# Patient Record
Sex: Female | Born: 1990 | Hispanic: No | Marital: Married | Smoking: Never smoker
Health system: Southern US, Community
[De-identification: ages and names within clinical notes are randomized; demographics above are authoritative.]

## PROBLEM LIST (undated history)

## (undated) DIAGNOSIS — F32A Depression, unspecified: Secondary | ICD-10-CM

## (undated) DIAGNOSIS — G43909 Migraine, unspecified, not intractable, without status migrainosus: Secondary | ICD-10-CM

## (undated) DIAGNOSIS — T7840XA Allergy, unspecified, initial encounter: Secondary | ICD-10-CM

## (undated) DIAGNOSIS — S62109A Fracture of unspecified carpal bone, unspecified wrist, initial encounter for closed fracture: Secondary | ICD-10-CM

## (undated) DIAGNOSIS — E282 Polycystic ovarian syndrome: Secondary | ICD-10-CM

## (undated) DIAGNOSIS — F419 Anxiety disorder, unspecified: Secondary | ICD-10-CM

## (undated) DIAGNOSIS — K219 Gastro-esophageal reflux disease without esophagitis: Secondary | ICD-10-CM

## (undated) HISTORY — DX: Migraine, unspecified, not intractable, without status migrainosus: G43.909

## (undated) HISTORY — DX: Depression, unspecified: F32.A

## (undated) HISTORY — DX: Fracture of unspecified carpal bone, unspecified wrist, initial encounter for closed fracture: S62.109A

## (undated) HISTORY — DX: Anxiety disorder, unspecified: F41.9

## (undated) HISTORY — DX: Allergy, unspecified, initial encounter: T78.40XA

## (undated) HISTORY — DX: Polycystic ovarian syndrome: E28.2

## (undated) HISTORY — DX: Gastro-esophageal reflux disease without esophagitis: K21.9

---

## 2019-12-02 HISTORY — PX: CHOLECYSTECTOMY: SHX55

## 2021-05-08 ENCOUNTER — Ambulatory Visit: Payer: Self-pay | Admitting: Nurse Practitioner

## 2021-05-21 ENCOUNTER — Encounter: Payer: Self-pay | Admitting: Family Medicine

## 2021-05-21 ENCOUNTER — Other Ambulatory Visit: Payer: Self-pay

## 2021-05-21 ENCOUNTER — Ambulatory Visit: Payer: BC Managed Care – PPO | Admitting: Family Medicine

## 2021-05-21 ENCOUNTER — Telehealth: Payer: Self-pay

## 2021-05-21 VITALS — BP 124/76 | HR 90 | Temp 98.9°F | Ht 64.0 in | Wt 256.0 lb

## 2021-05-21 DIAGNOSIS — K2100 Gastro-esophageal reflux disease with esophagitis, without bleeding: Secondary | ICD-10-CM | POA: Diagnosis not present

## 2021-05-21 DIAGNOSIS — E282 Polycystic ovarian syndrome: Secondary | ICD-10-CM | POA: Insufficient documentation

## 2021-05-21 DIAGNOSIS — E66813 Obesity, class 3: Secondary | ICD-10-CM | POA: Insufficient documentation

## 2021-05-21 DIAGNOSIS — Z3041 Encounter for surveillance of contraceptive pills: Secondary | ICD-10-CM

## 2021-05-21 DIAGNOSIS — G43709 Chronic migraine without aura, not intractable, without status migrainosus: Secondary | ICD-10-CM

## 2021-05-21 DIAGNOSIS — R1011 Right upper quadrant pain: Secondary | ICD-10-CM | POA: Diagnosis not present

## 2021-05-21 DIAGNOSIS — F419 Anxiety disorder, unspecified: Secondary | ICD-10-CM

## 2021-05-21 DIAGNOSIS — G43901 Migraine, unspecified, not intractable, with status migrainosus: Secondary | ICD-10-CM

## 2021-05-21 DIAGNOSIS — F32A Depression, unspecified: Secondary | ICD-10-CM

## 2021-05-21 MED ORDER — BUPROPION HCL ER (SR) 100 MG PO TB12: 200.0000 mg | ORAL_TABLET | Freq: Every day | ORAL | 2 refills | Status: DC

## 2021-05-21 MED ORDER — LO LOESTRIN FE 1 MG-10 MCG / 10 MCG PO TABS: 1.0000 | ORAL_TABLET | Freq: Every day | ORAL | 2 refills | Status: DC

## 2021-05-21 MED ORDER — PROPRANOLOL HCL 20 MG PO TABS: 20.0000 mg | ORAL_TABLET | Freq: Two times a day (BID) | ORAL | 2 refills | Status: DC

## 2021-05-21 MED ORDER — METFORMIN HCL ER (MOD) 1000 MG PO TB24: 1000.0000 mg | ORAL_TABLET | Freq: Every day | ORAL | 2 refills | Status: DC

## 2021-05-21 MED ORDER — NURTEC 75 MG PO TBDP: 75.0000 mg | ORAL_TABLET | ORAL | 2 refills | Status: DC

## 2021-05-21 MED ORDER — FAMOTIDINE 20 MG PO TABS: 20.0000 mg | ORAL_TABLET | Freq: Two times a day (BID) | ORAL | 2 refills | Status: DC

## 2021-05-21 NOTE — Patient Instructions (Signed)
-   Dose medications as prescribed - Review information provided - Obtain x-ray - Return in 2-4 weeks for annual physical - Contact for any questions

## 2021-05-21 NOTE — Telephone Encounter (Signed)
[facility] referring for (polycystic ovarian syndrome), Encounter for surveillance of contraceptive pills. Called and left voicemail for patient to call back to be scheduled.

## 2021-05-22 DIAGNOSIS — G43909 Migraine, unspecified, not intractable, without status migrainosus: Secondary | ICD-10-CM | POA: Insufficient documentation

## 2021-05-22 NOTE — Assessment & Plan Note (Signed)
Noted in the setting of comorbid PCOS, risk stratification labs to be ordered at follow-up for annual physical. We have placed referral to GYN for further evaluation and management of PCOS as well.

## 2021-05-22 NOTE — Progress Notes (Signed)
Primary Care / Sports Medicine Office Visit  Patient Information:  Patient ID: Vicki Yang, female DOB: [birth year] Age: 30 y.o. MRN: [id number]   Vicki Yang is a pleasant 30 y.o. female presenting with the following:  Chief Complaint  Patient presents with   New Patient (Initial Visit)   Establish Care    Moved from [place] 01/2021 and recently got insurance    Review of Systems pertinent details above   Patient Active Problem List   Diagnosis Date Noted   Migraine    PCOS (polycystic ovarian syndrome) 05/21/2021   Right upper quadrant abdominal pain 05/21/2021   Gastroesophageal reflux disease with esophagitis 05/21/2021   Obesity, Class III, BMI 40-49.9 (morbid obesity) (HCC) 05/21/2021   Anxiety and depression 05/21/2021   Past Medical History:  Diagnosis Date   Anxiety    Broken wrist    bilateral   Depression    GERD (gastroesophageal reflux disease)    Migraine    PCOS (polycystic ovarian syndrome)    Outpatient Encounter Medications as of 05/21/2021  Medication Sig   metFORMIN (GLUMETZA) 1000 MG (MOD) 24 hr tablet Take 1 tablet (1,000 mg total) by mouth daily with breakfast.   [DISCONTINUED] buPROPion ER (WELLBUTRIN SR) 100 MG 12 hr tablet Take 200 mg by mouth daily.   [DISCONTINUED] famotidine (PEPCID) 20 MG tablet Take 20 mg by mouth 2 (two) times daily.   [DISCONTINUED] metFORMIN (GLUCOPHAGE) 500 MG tablet Take 500 mg by mouth daily with breakfast.   [DISCONTINUED] Norethindrone-Ethinyl Estradiol-Fe Biphas (LO LOESTRIN FE) 1 MG-10 MCG / 10 MCG tablet Take 1 tablet by mouth daily.   [DISCONTINUED] propranolol (INDERAL) 20 MG tablet Take 20 mg by mouth 2 (two) times daily.   [DISCONTINUED] Rimegepant Sulfate (NURTEC) 75 MG TBDP Take 75 mg by mouth once as needed.   buPROPion ER (WELLBUTRIN SR) 100 MG 12 hr tablet Take 2 tablets (200 mg total) by mouth daily.   famotidine (PEPCID) 20 MG tablet Take 1 tablet (20 mg total) by mouth 2 (two) times daily.    Norethindrone-Ethinyl Estradiol-Fe Biphas (LO LOESTRIN FE) 1 MG-10 MCG / 10 MCG tablet Take 1 tablet by mouth daily.   propranolol (INDERAL) 20 MG tablet Take 1 tablet (20 mg total) by mouth 2 (two) times daily.   Rimegepant Sulfate (NURTEC) 75 MG TBDP Take 75 mg by mouth every other day.   No facility-administered encounter medications on file as of 05/21/2021.   Past Surgical History:  Procedure Laterality Date   CHOLECYSTECTOMY  12/2019    Vitals:   05/21/21 0905  BP: 124/76  Pulse: 90  Temp: 98.9 F (37.2 C)  SpO2: 97%   Vitals:   05/21/21 0905  Weight: 256 lb (116.1 kg)  Height: 5\' 4"  (1.626 m)   Body mass index is 43.94 kg/m.  No results found.   Independent interpretation of notes and tests performed by another provider:   None  Procedures performed:   None  Pertinent History, Exam, Impression, and Recommendations:   Gastroesophageal reflux disease with esophagitis Chronic issue that is controlled with twice daily dosing of famotidine 20 mg.  Examination today reveals benign abdominal findings save for hypoactive bowel sounds.  KUB ordered today, medication management performed.  She will return for annual physical where risk stratification labs to be ordered, H. pylori testing to be considered as well.  Anxiety and depression Depression screen [facility] 2/9 05/21/2021  Decreased Interest 0  Down, Depressed, Hopeless 0  PHQ - 2 Score  0  Altered sleeping 0  Tired, decreased energy 3  Change in appetite 1  Feeling bad or failure about yourself  1  Trouble concentrating 1  Moving slowly or fidgety/restless 0  Suicidal thoughts 0  PHQ-9 Score 6  Difficult doing work/chores Somewhat difficult   GAD 7 : Generalized Anxiety Score 05/21/2021  Nervous, Anxious, on Edge 3  Control/stop worrying 2  Worry too much - different things 2  Trouble relaxing 1  Restless 0  Easily annoyed or irritable 1  Afraid - awful might happen 0  Total GAD 7 Score 9  Anxiety  Difficulty Somewhat difficult   Chronic condition with stable symptomatology with above listed PHQ and GAD scores. Tolerating current medications without issue.  Obesity, Class III, BMI 40-49.9 (morbid obesity) (HCC) Noted in the setting of comorbid PCOS, risk stratification labs to be ordered at follow-up for annual physical. We have placed referral to GYN for further evaluation and management of PCOS as well.  PCOS (polycystic ovarian syndrome) Chronic issue, referral placed to GYN for further evaluation management.  In the interim we have discussed titration of her metformin to 1000 mg daily, this was prescribed today.  Migraine Chronic condition, previous Nurtec was being dosed on an as-needed basis, despite this she has noted increased frequency, prophylactic dose reviewed and she is amenable to 75 mg every other day dosing.  This was prescribed to her pharmacy on file.  For recalcitrant symptomatology, additional pharmacotherapy and referral to neurology to be considered.  Right upper quadrant abdominal pain Chronic waxing and waning condition in the setting of cholecystectomy 2021.  Plan for serum studies at her return for annual physical, KUB x-ray ordered today.   Orders & Medications Meds ordered this encounter  Medications   buPROPion ER (WELLBUTRIN SR) 100 MG 12 hr tablet    Sig: Take 2 tablets (200 mg total) by mouth daily.    Dispense:  60 tablet    Refill:  2   famotidine (PEPCID) 20 MG tablet    Sig: Take 1 tablet (20 mg total) by mouth 2 (two) times daily.    Dispense:  60 tablet    Refill:  2   metFORMIN (GLUMETZA) 1000 MG (MOD) 24 hr tablet    Sig: Take 1 tablet (1,000 mg total) by mouth daily with breakfast.    Dispense:  30 tablet    Refill:  2   propranolol (INDERAL) 20 MG tablet    Sig: Take 1 tablet (20 mg total) by mouth 2 (two) times daily.    Dispense:  60 tablet    Refill:  2   Rimegepant Sulfate (NURTEC) 75 MG TBDP    Sig: Take 75 mg by mouth every  other day.    Dispense:  15 tablet    Refill:  2   Norethindrone-Ethinyl Estradiol-Fe Biphas (LO LOESTRIN FE) 1 MG-10 MCG / 10 MCG tablet    Sig: Take 1 tablet by mouth daily.    Dispense:  30 tablet    Refill:  2   Orders Placed This Encounter  Procedures   DG Abd 1 View   Ambulatory referral to Gynecology     Return in about 2 weeks (around 06/04/2021) for 2-4 weeks annual physical.     Jerrol Banana, MD   Primary Care Sports Medicine [facility]

## 2021-05-22 NOTE — Assessment & Plan Note (Signed)
Depression screen PHQ 2/9 05/21/2021  Decreased Interest 0  Down, Depressed, Hopeless 0  PHQ - 2 Score 0  Altered sleeping 0  Tired, decreased energy 3  Change in appetite 1  Feeling bad or failure about yourself  1  Trouble concentrating 1  Moving slowly or fidgety/restless 0  Suicidal thoughts 0  PHQ-9 Score 6  Difficult doing work/chores Somewhat difficult   GAD 7 : Generalized Anxiety Score 05/21/2021  Nervous, Anxious, on Edge 3  Control/stop worrying 2  Worry too much - different things 2  Trouble relaxing 1  Restless 0  Easily annoyed or irritable 1  Afraid - awful might happen 0  Total GAD 7 Score 9  Anxiety Difficulty Somewhat difficult   Chronic condition with stable symptomatology with above listed PHQ and GAD scores. Tolerating current medications without issue.

## 2021-05-22 NOTE — Assessment & Plan Note (Signed)
Chronic issue, referral placed to GYN for further evaluation management.  In the interim we have discussed titration of her metformin to 1000 mg daily, this was prescribed today.

## 2021-05-22 NOTE — Assessment & Plan Note (Signed)
Chronic waxing and waning condition in the setting of cholecystectomy 2021.  Plan for serum studies at her return for annual physical, KUB x-ray ordered today.

## 2021-05-22 NOTE — Assessment & Plan Note (Addendum)
Chronic issue that is controlled with twice daily dosing of famotidine 20 mg.  Examination today reveals benign abdominal findings save for hypoactive bowel sounds.  KUB ordered today, medication management performed.  She will return for annual physical where risk stratification labs to be ordered, H. pylori testing to be considered as well.

## 2021-05-22 NOTE — Assessment & Plan Note (Signed)
Chronic condition, previous Nurtec was being dosed on an as-needed basis, despite this she has noted increased frequency, prophylactic dose reviewed and she is amenable to 75 mg every other day dosing.  This was prescribed to her pharmacy on file.  For recalcitrant symptomatology, additional pharmacotherapy and referral to neurology to be considered.

## 2021-05-22 NOTE — Telephone Encounter (Signed)
Called and left voicemail for patient to call back to be scheduled. 

## 2021-05-28 ENCOUNTER — Encounter: Payer: Self-pay | Admitting: Family Medicine

## 2021-05-29 ENCOUNTER — Telehealth: Payer: Self-pay

## 2021-05-29 NOTE — Telephone Encounter (Signed)
Completed PA on [email or web address] for Metformin 1000mg . 30 for 30 days.  Key:  Awaiting outcome.

## 2021-05-29 NOTE — Telephone Encounter (Signed)
Completed PA on [email or web address] for NURTEC 75 mg tabs.   (Key: [id number])  Awaiting outcome.

## 2021-06-02 NOTE — Telephone Encounter (Signed)
PA was denied by pts insurance.

## 2021-06-02 NOTE — Telephone Encounter (Signed)
See notes Chassidy printed about denial.

## 2021-06-02 NOTE — Telephone Encounter (Signed)
Patient insurance DENIED medication.

## 2021-06-05 ENCOUNTER — Encounter: Payer: Self-pay | Admitting: Obstetrics and Gynecology

## 2021-06-05 ENCOUNTER — Other Ambulatory Visit: Payer: Self-pay | Admitting: Family Medicine

## 2021-06-05 ENCOUNTER — Ambulatory Visit: Payer: BC Managed Care – PPO | Admitting: Obstetrics and Gynecology

## 2021-06-05 ENCOUNTER — Other Ambulatory Visit: Payer: Self-pay

## 2021-06-05 VITALS — BP 125/84 | Ht 64.0 in | Wt 255.0 lb

## 2021-06-05 DIAGNOSIS — G43709 Chronic migraine without aura, not intractable, without status migrainosus: Secondary | ICD-10-CM

## 2021-06-05 DIAGNOSIS — E282 Polycystic ovarian syndrome: Secondary | ICD-10-CM | POA: Diagnosis not present

## 2021-06-05 MED ORDER — SUMATRIPTAN SUCCINATE 50 MG PO TABS
50.0000 mg | ORAL_TABLET | ORAL | 0 refills | Status: DC | PRN
Start: 2021-06-05 — End: 2021-06-18

## 2021-06-05 NOTE — Telephone Encounter (Signed)
Sumatriptan 50 mg PRN dose sent to pharmacy as alternative. Please advise patient, thanks.

## 2021-06-06 ENCOUNTER — Encounter: Payer: Self-pay | Admitting: Family Medicine

## 2021-06-06 NOTE — Telephone Encounter (Signed)
Spoke with patient.    Tried and Failed Sumatriptan, Rizatriptan, Zolmitriptan, and Eletriptan.

## 2021-06-10 ENCOUNTER — Telehealth: Payer: Self-pay

## 2021-06-10 ENCOUNTER — Other Ambulatory Visit: Payer: Self-pay | Admitting: Family Medicine

## 2021-06-10 DIAGNOSIS — G43709 Chronic migraine without aura, not intractable, without status migrainosus: Secondary | ICD-10-CM

## 2021-06-10 MED ORDER — NURTEC 75 MG PO TBDP: 75.0000 mg | ORAL_TABLET | ORAL | 2 refills | Status: DC

## 2021-06-10 NOTE — Telephone Encounter (Signed)
I am okay performing a peer to peer, please schedule accordingly.

## 2021-06-10 NOTE — Telephone Encounter (Signed)
Copied from CRM [id number]. Topic: General - Other >> Jun 10, 2021 12:50 PM Jaquita Rector A wrote: Reason for CRM: [employer] rep called in regarding a prior authorization for Rimegepant Sulfate (NURTEC) 75 MG TB DP that was denied,  asking for Dr Ashley Royalty to schedule a pear  to pare or a provider courtesy review can call patient at her phone number on file

## 2021-06-10 NOTE — Telephone Encounter (Signed)
See MyChart encounter.  Patient's insurance only approves 15 tablets per month, but each pack contains 8 tablets and cannot be broken up.

## 2021-06-10 NOTE — Telephone Encounter (Signed)
Left patient detailed voicemail explaining denial of Nurtec appeal.  Advised for patient to go online and activate a copay card she can present to the pharmacy so insurance will not be ran.  Patient has follow-up appointment on 06/18/21 and should have enough samples to last her until then since taking every other day.  For your information.

## 2021-06-11 NOTE — Telephone Encounter (Signed)
Spoke with Vicki Yang with [employer] [phone]) and she states that rather than a peer-to-peer, a quantity limit exception needs to be done.  Pending fax for form that needs to be filled out for this.  For your information.

## 2021-06-12 ENCOUNTER — Other Ambulatory Visit: Payer: Self-pay | Admitting: Family Medicine

## 2021-06-12 DIAGNOSIS — E282 Polycystic ovarian syndrome: Secondary | ICD-10-CM

## 2021-06-12 MED ORDER — METFORMIN HCL 500 MG PO TABS: 500.0000 mg | ORAL_TABLET | Freq: Three times a day (TID) | ORAL | 0 refills | Status: DC

## 2021-06-17 ENCOUNTER — Ambulatory Visit
Admission: RE | Admit: 2021-06-17 | Discharge: 2021-06-17 | Disposition: A | Discharge status: Home / Self Care | Payer: BC Managed Care – PPO | Source: Ambulatory Visit | Attending: Family Medicine | Admitting: Family Medicine

## 2021-06-17 ENCOUNTER — Ambulatory Visit
Admission: RE | Admit: 2021-06-17 | Discharge: 2021-06-17 | Disposition: A | Discharge status: Home / Self Care | Payer: BC Managed Care – PPO | Attending: Family Medicine | Admitting: Family Medicine

## 2021-06-17 ENCOUNTER — Other Ambulatory Visit: Payer: Self-pay

## 2021-06-17 DIAGNOSIS — R1011 Right upper quadrant pain: Secondary | ICD-10-CM | POA: Insufficient documentation

## 2021-06-18 ENCOUNTER — Ambulatory Visit (INDEPENDENT_AMBULATORY_CARE_PROVIDER_SITE_OTHER): Payer: BC Managed Care – PPO | Admitting: Family Medicine

## 2021-06-18 ENCOUNTER — Encounter: Payer: Self-pay | Admitting: Family Medicine

## 2021-06-18 VITALS — BP 102/68 | HR 83 | Ht 64.0 in | Wt 254.0 lb

## 2021-06-18 DIAGNOSIS — R1011 Right upper quadrant pain: Secondary | ICD-10-CM | POA: Diagnosis not present

## 2021-06-18 DIAGNOSIS — G43901 Migraine, unspecified, not intractable, with status migrainosus: Secondary | ICD-10-CM

## 2021-06-18 DIAGNOSIS — Z114 Encounter for screening for human immunodeficiency virus [HIV]: Secondary | ICD-10-CM

## 2021-06-18 DIAGNOSIS — K5900 Constipation, unspecified: Secondary | ICD-10-CM

## 2021-06-18 DIAGNOSIS — Z0000 Encounter for general adult medical examination without abnormal findings: Secondary | ICD-10-CM

## 2021-06-18 DIAGNOSIS — Z13220 Encounter for screening for lipoid disorders: Secondary | ICD-10-CM | POA: Diagnosis not present

## 2021-06-18 DIAGNOSIS — E282 Polycystic ovarian syndrome: Secondary | ICD-10-CM | POA: Diagnosis not present

## 2021-06-18 DIAGNOSIS — E559 Vitamin D deficiency, unspecified: Secondary | ICD-10-CM | POA: Diagnosis not present

## 2021-06-18 DIAGNOSIS — Z1159 Encounter for screening for other viral diseases: Secondary | ICD-10-CM

## 2021-06-18 HISTORY — DX: Encounter for general adult medical examination without abnormal findings: Z00.00

## 2021-06-18 MED ORDER — METFORMIN HCL ER 500 MG PO TB24: 500.0000 mg | ORAL_TABLET | Freq: Every day | ORAL | 11 refills | Status: DC

## 2021-06-18 NOTE — Assessment & Plan Note (Signed)
Annual examination completed, risk stratification labs ordered, anticipatory guidance provided.  We will follow labs once resulted. 

## 2021-06-18 NOTE — Assessment & Plan Note (Signed)
Involvement noted on recent KUB x-rays, I have advised management with increased free water intake, senna-S titration dose, Metamucil, and we will follow-up on this issue in 3 months time.  She was also advised to limit carbonated beverage intake.

## 2021-06-18 NOTE — Assessment & Plan Note (Signed)
In the setting of recent KUB findings, this can be secondary to constipation.  That being said, labs will be ordered to assess hepatic function.

## 2021-06-18 NOTE — Progress Notes (Signed)
Annual Physical Exam Visit  Patient Information:  Patient ID: Vicki Yang, female DOB: Oct 28, 1990 Age: 30 y.o. MRN: [id number]   Subjective:   CC: Annual Physical Exam  HPI:  Latorie Montesano is here for their annual physical.  I reviewed the past medical history, family history, social history, surgical history, and allergies today and changes were made as necessary.  Please see the problem list section below for additional details.  Past Medical History: Past Medical History:  Diagnosis Date   Allergy    Anxiety    Broken wrist    bilateral   Depression    GERD (gastroesophageal reflux disease)    Migraine    PCOS (polycystic ovarian syndrome)    Past Surgical History: Past Surgical History:  Procedure Laterality Date   CHOLECYSTECTOMY  12/2019   Family History: Family History  Problem Relation Age of Onset   Depression Mother    Hypertension Mother    Ovarian cysts Mother    Migraines Mother    Heart disease Mother    Hypertension Father    Diabetes type I Father    Retinal degeneration Father    Mental illness Brother    Diabetes Maternal Grandmother    Lymphoma Maternal Grandmother    Ovarian cancer Maternal Great-grandmother    Allergies: Allergies  Allergen Reactions   Cefoperazone Rash   Sulfa Antibiotics Rash   Health Maintenance: Health Maintenance  Topic Date Due   Hepatitis C Screening  Never done   PAP-Cervical Cytology Screening  Never done   PAP SMEAR-Modifier  Never done   COVID-19 Vaccine (1) 06/21/2021 (Originally 04/21/1991)   INFLUENZA VACCINE  10/31/2021 (Originally 03/03/2021)   TETANUS/TDAP  06/05/2022 (Originally 10/20/2010)   HIV Screening  Completed   Pneumococcal Vaccine 75-54 Years old  Aged Out   HPV VACCINES  Aged Out    HM Colonoscopy     This patient has no relevant Health Maintenance data.      Medications: Current Outpatient Medications on File Prior to Visit  Medication Sig Dispense Refill   buPROPion  ER (WELLBUTRIN SR) 100 MG 12 hr tablet Take 2 tablets (200 mg total) by mouth daily. 60 tablet 2   famotidine (PEPCID) 20 MG tablet Take 1 tablet (20 mg total) by mouth 2 (two) times daily. 60 tablet 2   fluticasone (FLONASE) 50 MCG/ACT nasal spray Place 1 spray into both nostrils daily.     Norethindrone-Ethinyl Estradiol-Fe Biphas (LO LOESTRIN FE) 1 MG-10 MCG / 10 MCG tablet Take 1 tablet by mouth daily. 30 tablet 2   propranolol (INDERAL) 20 MG tablet Take 1 tablet (20 mg total) by mouth 2 (two) times daily. 60 tablet 2   Rimegepant Sulfate (NURTEC) 75 MG TBDP Take 75 mg by mouth every other day. 16 tablet 2   No current facility-administered medications on file prior to visit.    Review of Systems: No headache, visual changes, nausea, vomiting, diarrhea, constipation, dizziness, +abdominal pain, skin rash, fevers, chills, night sweats, swollen lymph nodes, weight loss, chest pain, body aches, joint swelling, muscle aches, shortness of breath, +mood changes, visual or auditory hallucinations reported.  Objective:   Vitals:   06/18/21 0825  BP: 102/68  Pulse: 83  SpO2: 97%   Vitals:   06/18/21 0825  Weight: 254 lb (115.2 kg)  Height: 5\' 4"  (1.626 m)   Body mass index is 43.6 kg/m.  General: Well Developed, well nourished, and in no acute distress.  Neuro: Alert and oriented  x3, extra-ocular muscles intact, sensation grossly intact. Cranial nerves II through XII are grossly intact, motor, sensory, and coordinative functions are intact. HEENT: Normocephalic, atraumatic, pupils equal round reactive to light, neck supple, no masses, no lymphadenopathy, thyroid nonpalpable. Oropharynx, nasopharynx, external ear canals are unremarkable. Skin: Warm and dry, no rashes noted.  Cardiac: Regular rate and rhythm, no murmurs rubs or gallops. No peripheral edema. Pulses symmetric. Respiratory: Clear to auscultation bilaterally. Not using accessory muscles, speaking in full sentences.   Abdominal: Soft, nontender, nondistended, positive normoactive bowel sounds, no masses, no organomegaly. Musculoskeletal: Shoulder, elbow, wrist, hip, knee, ankle stable, and with full range of motion.  Female chaperone initials: BN present throughout the physical examination.  Impression and Recommendations:   The patient was counselled, risk factors were discussed, and anticipatory guidance given.  Constipation Involvement noted on recent KUB x-rays, I have advised management with increased free water intake, senna-S titration dose, Metamucil, and we will follow-up on this issue in 3 months time.  She was also advised to limit carbonated beverage intake.  Right upper quadrant abdominal pain In the setting of recent KUB findings, this can be secondary to constipation.  That being said, labs will be ordered to assess hepatic function.  PCOS (polycystic ovarian syndrome) Patient has established with OB/GYN, metformin XL 500 mg refilled, we will follow peripherally on this issue.  Migraine Chronic condition with patient reported recurrence of symptomatology despite prophylactic Nurtec dosing.  She denied any pain with this episode of recurrence but did note "sense of impending doom ", difficulty with focus and concentration issues.  This can possibly be thought secondary to breakthrough migraine, additional etiologies can include undiagnosed ADHD, suboptimal control of anxiety/depression.  At this stage, given her significant migraine history, I have reviewed treatment strategies and we will proceed with neurology referral for further evaluation and management.  If maximal migraine control achieved and patient still reports these breakthrough symptoms, review of additional etiologies to take place.  I will have the patient return for follow-up in 3 months for reevaluation.  Annual physical exam Annual examination completed, risk stratification labs ordered, anticipatory guidance provided.  We  will follow labs once resulted.  Orders & Medications Medications:  Meds ordered this encounter  Medications   metFORMIN (GLUCOPHAGE XR) 500 MG 24 hr tablet    Sig: Take 1 tablet (500 mg total) by mouth daily with breakfast.    Dispense:  30 tablet    Refill:  11   Orders Placed This Encounter  Procedures   TSH Rfx on Abnormal to Free T4   Lipid panel   Comprehensive metabolic panel   CBC   VITAMIN D 25 Hydroxy (Vit-D Deficiency, Fractures)   Hepatitis C antibody   HIV Antibody (routine testing w rflx)   Ambulatory referral to Neurology     Return in about 3 months (around 09/18/2021).    Jerrol Banana, MD   Primary Care Sports Medicine [facility]

## 2021-06-18 NOTE — Telephone Encounter (Signed)
Quantity limit exception form completed and placed on desk for review and signature.  For your information.

## 2021-06-18 NOTE — Assessment & Plan Note (Signed)
Chronic condition with patient reported recurrence of symptomatology despite prophylactic Nurtec dosing.  She denied any pain with this episode of recurrence but did note "sense of impending doom ", difficulty with focus and concentration issues.  This can possibly be thought secondary to breakthrough migraine, additional etiologies can include undiagnosed ADHD, suboptimal control of anxiety/depression.  At this stage, given her significant migraine history, I have reviewed treatment strategies and we will proceed with neurology referral for further evaluation and management.  If maximal migraine control achieved and patient still reports these breakthrough symptoms, review of additional etiologies to take place.  I will have the patient return for follow-up in 3 months for reevaluation.

## 2021-06-18 NOTE — Patient Instructions (Addendum)
-   Obtain fasting labs with orders provided (can have water or black coffee but otherwise no food or drink x 8 hours before labs) - Start senna-S (senna docusate) twice daily until at least once daily smooth bowel movement achieved, then adjust senna-S dosing to allow the same - Increase free water intake (minimize carbonated beverages) - Start once daily Metamucil - Review information provided - Attend eye doctor annually, dentist every 6 months, work towards or maintain 30 minutes of moderate intensity physical activity at least 5 days per week, and consume a balanced diet - Return in 3 months for follow-up - Contact us for any questions between now and then

## 2021-06-18 NOTE — Assessment & Plan Note (Signed)
Patient has established with OB/GYN, metformin XL 500 mg refilled, we will follow peripherally on this issue.

## 2021-06-19 LAB — CBC
Hematocrit: 41.1 % (ref 34.0–46.6)
Hemoglobin: 13.7 g/dL (ref 11.1–15.9)
MCH: 28.9 pg (ref 26.6–33.0)
MCHC: 33.3 g/dL (ref 31.5–35.7)
MCV: 87 fL (ref 79–97)
Platelets: 355 10*3/uL (ref 150–450)
RBC: 4.74 x10E6/uL (ref 3.77–5.28)
RDW: 12.1 % (ref 11.7–15.4)
WBC: 7.8 10*3/uL (ref 3.4–10.8)

## 2021-06-19 LAB — COMPREHENSIVE METABOLIC PANEL
ALT: 19 IU/L (ref 0–32)
AST: 19 IU/L (ref 0–40)
Albumin/Globulin Ratio: 1.8 (ref 1.2–2.2)
Albumin: 4.5 g/dL (ref 3.9–5.0)
Alkaline Phosphatase: 120 IU/L (ref 44–121)
BUN/Creatinine Ratio: 14 (ref 9–23)
BUN: 11 mg/dL (ref 6–20)
Bilirubin Total: 0.5 mg/dL (ref 0.0–1.2)
CO2: 19 mmol/L — ABNORMAL LOW (ref 20–29)
Calcium: 9.2 mg/dL (ref 8.7–10.2)
Chloride: 103 mmol/L (ref 96–106)
Creatinine, Ser: 0.79 mg/dL (ref 0.57–1.00)
Globulin, Total: 2.5 g/dL (ref 1.5–4.5)
Glucose: 86 mg/dL (ref 70–99)
Potassium: 4.6 mmol/L (ref 3.5–5.2)
Sodium: 138 mmol/L (ref 134–144)
Total Protein: 7 g/dL (ref 6.0–8.5)
eGFR: 104 mL/min/{1.73_m2} (ref >59)

## 2021-06-19 LAB — LIPID PANEL
Chol/HDL Ratio: 5.3 ratio — ABNORMAL HIGH (ref 0.0–4.4)
Cholesterol, Total: 269 mg/dL — ABNORMAL HIGH (ref 100–199)
HDL: 51 mg/dL (ref >39)
LDL Chol Calc (NIH): 193 mg/dL — ABNORMAL HIGH (ref 0–99)
Triglycerides: 138 mg/dL (ref 0–149)
VLDL Cholesterol Cal: 25 mg/dL (ref 5–40)

## 2021-06-19 LAB — VITAMIN D 25 HYDROXY (VIT D DEFICIENCY, FRACTURES): Vit D, 25-Hydroxy: 17 ng/mL — ABNORMAL LOW (ref 30.0–100.0)

## 2021-06-19 LAB — HIV ANTIBODY (ROUTINE TESTING W REFLEX): HIV Screen 4th Generation wRfx: NONREACTIVE

## 2021-06-19 LAB — HEPATITIS C ANTIBODY: Hep C Virus Ab: <0.1 s/co ratio (ref 0.0–0.9)

## 2021-06-19 LAB — TSH RFX ON ABNORMAL TO FREE T4: TSH: 1.26 u[IU]/mL (ref 0.450–4.500)

## 2021-06-19 NOTE — Progress Notes (Signed)
Gynecology H&P  PCP: Montel Culver, MD  Chief Complaint:  Chief Complaint  Patient presents with   Consult    Referral for PCOS - discuss options for low estrogen OCP. Procedure RM    History of Present Illness: Patient is a 30 y.o. G0P0000 presenting for evaluation of PCOS.  She has a previously established diagnosis of PCOS by her prior gynecologist.  Diagnosis was made based on evidence of excess androgens as well as otherwise normal labs and absence of regular menstrual cycles.  She never had an ultrasound as she met 2 out of 3 [place] criteria.  She is currently on Lo Loestrine Fe.  She is not currently interested in conceiving but interested in discussing any additional steps she need to consider.   Review of Systems: 10 point review of systems negative unless otherwise noted in HPI  Past Medical History:  Patient Active Problem List   Diagnosis Date Noted   Constipation 06/18/2021   Annual physical exam 06/18/2021   Migraine     Tried and Failed Sumatriptan, Rizatriptan, Zolmitriptan, and Eletriptan.     PCOS (polycystic ovarian syndrome) 05/21/2021   Right upper quadrant abdominal pain 05/21/2021   Gastroesophageal reflux disease with esophagitis 05/21/2021   Obesity, Class III, BMI 40-49.9 (morbid obesity) ([place]) 05/21/2021   Anxiety and depression 05/21/2021    Past Surgical History:  Past Surgical History:  Procedure Laterality Date   CHOLECYSTECTOMY  12/2019    Family History:  Family History  Problem Relation Age of Onset   Depression Mother    Hypertension Mother    Ovarian cysts Mother    Migraines Mother    Heart disease Mother    Hypertension Father    Diabetes type I Father    Retinal degeneration Father    Mental illness Brother    Diabetes Maternal Grandmother    Lymphoma Maternal Grandmother    Ovarian cancer Maternal Great-grandmother     Social History:  Social History   Socioeconomic History   Marital status: Married    Spouse  name: Emelda Fear   Number of children: 0   Years of education: 7   Highest education level: Doctorate  Occupational History   Occupation: Psychology Professor  Tobacco Use   Smoking status: Never   Smokeless tobacco: Never  Vaping Use   Vaping Use: Never used  Substance and Sexual Activity   Alcohol use: Yes   Drug use: Never   Sexual activity: Yes    Partners: Male    Birth control/protection: OCP  Other Topics Concern   Not on file  Social History Narrative   Not on file   Social Determinants of Health   Financial Resource Strain: Not on file  Food Insecurity: Not on file  Transportation Needs: Not on file  Physical Activity: Not on file  Stress: Not on file  Social Connections: Not on file  Intimate Partner Violence: Not on file    Allergies:  Allergies  Allergen Reactions   Cefoperazone Rash   Sulfa Antibiotics Rash    Medications: Prior to Admission medications   Medication Sig Start Date End Date Taking? Authorizing Provider  buPROPion ER (WELLBUTRIN SR) 100 MG 12 hr tablet Take 2 tablets (200 mg total) by mouth daily. 05/21/21 08/19/21 Yes Montel Culver, MD  famotidine (PEPCID) 20 MG tablet Take 1 tablet (20 mg total) by mouth 2 (two) times daily. 05/21/21 08/19/21 Yes Montel Culver, MD  Norethindrone-Ethinyl Estradiol-Fe Biphas (LO LOESTRIN FE) 1  MG-10 MCG / 10 MCG tablet Take 1 tablet by mouth daily. 05/21/21 08/19/21 Yes Montel Culver, MD  propranolol (INDERAL) 20 MG tablet Take 1 tablet (20 mg total) by mouth 2 (two) times daily. 05/21/21 08/19/21 Yes Montel Culver, MD  fluticasone (FLONASE) 50 MCG/ACT nasal spray Place 1 spray into both nostrils daily.    [provider]  metFORMIN (GLUCOPHAGE XR) 500 MG 24 hr tablet Take 1 tablet (500 mg total) by mouth daily with breakfast. 06/18/21 06/18/22  Montel Culver, MD  Rimegepant Sulfate (NURTEC) 75 MG TBDP Take 75 mg by mouth every other day. 06/10/21   Montel Culver, MD     Physical Exam Vitals: Blood pressure 125/84, height _0  (1.626 m), weight 255 lb (115.7 kg).  General: NAD HEENT: normocephalic, anicteric Pulmonary: No increased work of breathing Genitourinary: deferred Neurologic: Grossly intact Psychiatric: mood appropriate, affect full  * No order type specified *  Assessment: 30 y.o. G0P0000 presenting for initial infertility evaluatoin  Plan:  1) We discussed the underlying etiologies which may be implicated in a couple experiencing difficulty conceiving.  The average couple will conceive within the span of 1 year with unprotected coitus, with a monthly fecundity rate of 20% or 1 in 5.  Even without further work up or intervention the patient and her partner may be successful in conceiving unassisted, although if an underlying etiology can be identified and addressed fecundity rate may improve.  The work up entails examining for ovulatory function, tubal patency, and ruling out female factor infertility.  These may be looked at concurrently or sequentially.  The downside of sequential work up is that this method may miss issues if more than one compartment is contributing.  She is aware that tubal factor or moderate to severe female factor infertility will require further consultation with a reproductive endocrinologist.  In the case of anovulation, use of Clomid (clomiphen citrate) or Femara (letrazole) were discussed with the understanding the the later is an off-label, but well supported use.  With either of these drugs the risk of multiples increases from the standard population rate of 2% to approximately 10%, with higher order multiples possible but unlikely.  Both drugs may require some time to titrate to the appropriate dosage to ensure consistent ovulation.  Cycles will be limited to 6 cycles on each drug secondary to decreasing rates of conception after 6 cycles.  In addition should patient be started on ovulation induction with Clomid she was  advised to discontinue the drug for any vision changes as this is a rare but potentially permanent side-effect if medication is continued.  We discussed timing of intercourse as well as the use of ovulation predictor kits identify the patient's fertile window each month.     - patient not currently attempting to conceive but may be interested in conceiving in the future - we discussed that the mainstay of treatment for PCOS outside of the setting of trying to conceive is menstrual control using hormonal contraception.  She is currently on the lowest dose OCP currently available - weight loss management may also result in return of normal menstruation in patient with PCOS.  Medical weight loss management with pharmacotherapy may be considsered once patient interest in conceiving - there is no clinical utility in repeat PCOS labs if these have previously been obtained  2) Pap declines reports previously done at prior provider before moving to [place]  3) A total of 22 minutes were spent in face-to-face contact  with the patient during this encounter with over half of that time devoted to counseling and coordination of care.   4) Return call once ready to persue pregnancy later next summer.    Malachy Mood, MD, [facility], [place]

## 2021-06-20 ENCOUNTER — Other Ambulatory Visit: Payer: Self-pay | Admitting: Family Medicine

## 2021-06-20 DIAGNOSIS — E559 Vitamin D deficiency, unspecified: Secondary | ICD-10-CM

## 2021-06-20 DIAGNOSIS — E7800 Pure hypercholesterolemia, unspecified: Secondary | ICD-10-CM

## 2021-06-20 MED ORDER — ROSUVASTATIN CALCIUM 10 MG PO TABS
10.0000 mg | ORAL_TABLET | Freq: Every day | ORAL | 3 refills | Status: DC
Start: 2021-06-20 — End: 2022-06-11

## 2021-06-20 MED ORDER — VITAMIN D (ERGOCALCIFEROL) 1.25 MG (50000 UNIT) PO CAPS: 50000.0000 [IU] | ORAL_CAPSULE | ORAL | 0 refills | Status: DC

## 2021-06-20 NOTE — Progress Notes (Signed)
Vicki Yang, the labs came back normal aside from the following: - The vitamin D is low enough that you would benefit from an 8 week course of Rx vitamin D dosed weekly, I have sent this in - The cholesterol, specifically the total and "bad" (LDL) cholesterol is high enough that you would benefit from a cholesterol lowering medication called a statin. This is to reduce your risk for cardiovascular events (stroke, heart attack, etc). I have sent one in to start daily.  Additionally, healthy lifestyle changes can help work towards reducing overall risk; I have included information from the [employer] of Cardiology below:  Nutrition and Diet To reduce ASCVD risk in all patients: - A diet emphasizing intake of vegetables, fruits, legumes, nuts, whole grains, and fish is recommended (I, B-R). A diet containing reduced amounts of cholesterol and sodium can be beneficial (IIa, B-NR). - Replacement of saturated fat with dietary mono- and poly-unsaturated fats can be beneficial (IIa, B-NR). - Minimizing the intake of trans fats, processed meats, refined carbohydrates, and sweetened beverages as part of a heart healthy diet is reasonable (IIa, B-NR). For adults with type 2 diabetes mellitus: - A tailored nutrition plan focusing on a heart-healthy dietary pattern is recommended to improve glycemic control, achieve weight loss (if needed), and improve other ASCVD risk factors (I, A).  Exercise and Physical Activity To reduce ASCVD risk, adults should: - Be routinely counseled to optimize a physically active lifestyle (I, B-R). - Engage in at least 150 minutes per week of accumulated moderate intensity or 75 minutes per week of vigorous intensity aerobic physical activity (or an equivalent combination of moderate and vigorous activity) (I, B-NR). This includes adults with type 2 diabetes mellitus (I, A). - Decrease sedentary behavior (IIb, C-LD). For adults unable to meet the minimum physical activity  recommendations: - Engaging in some moderate or vigorous intensity physical activity, even if less than this recommended amount, can be beneficial to reduce ASCVD risk (IIa, B-NR). Intensity METS Examples Sedentary Behavior* 1-1.5 Sitting, reclining, or lying; watching TV Light 1.6-2.9 Walking slowly, cooking, light house work Moderate 3.0-5.9 Brisk walking (2.4-64mph), biking 5-10mph, ballroom dancing, active yoga, recreational swimming Vigorous =6 Jogging/running, biking =53mph, singles tennis, swimming laps*Sedentary behavior is defined as any waking behavior characterized by an energy expenditure =1.5 metabolic equivalents (METs), while in a sitting, reclining, or lying posture. Standing is a sedentary activity in that it involves =1.5 METs, but is not considered a component of sedentary behavior; mph indicates miles per hour  Obesity and Being Overweight In overweight and obese adults: - Weight loss is recommended to improve the ASCVD risk-factor profile (I, B-R). - Counseling and comprehensive lifestyle interventions, including calorie restriction, are recommended for achieving and maintaining weight loss (I, B-R). - Calculating body mass index is recommended annually or more frequently to identify overweight and obese adults for weight loss considerations (I, C-EO). - It is reasonable to measure waist circumference to identify those at higher cardiometabolic risk (IIa, B-NR).  Sorry for any formatting issues. Please reach out for any questions.

## 2021-06-20 NOTE — Telephone Encounter (Signed)
Form faxed.  Pending response.

## 2021-07-03 NOTE — Telephone Encounter (Signed)
I faxed the quality exception form, which was the only thing that was required for her to get the 16 tablets rather than the 15 tablets, and I never heard a decision.  I spoke to the insurance directly to get this information.  It is not possible for patient to get 15 tablets because the packs of medication come in 8 tablets to the pack and cannot be separated.  The form should have been scanned into the chart as I labeled it after faxing, so I am not sure why it's not under media.

## 2021-07-04 ENCOUNTER — Telehealth: Payer: Self-pay

## 2021-07-04 NOTE — Telephone Encounter (Signed)
Completed PA for Nurtec 75 mg 16 tablets for 30 days for preventative treatment.   (Key: [id number])   Awaiting new outcome. First PA I did patients name was not correct with her insurance company.

## 2021-07-07 NOTE — Telephone Encounter (Signed)
PA Denied. Will inform patient the request does not meet the definition of Medical Necessity found in the members benefit booklet.

## 2021-08-15 ENCOUNTER — Other Ambulatory Visit: Payer: Self-pay

## 2021-08-15 DIAGNOSIS — G43709 Chronic migraine without aura, not intractable, without status migrainosus: Secondary | ICD-10-CM

## 2021-08-15 MED ORDER — NURTEC 75 MG PO TBDP: 75.0000 mg | ORAL_TABLET | ORAL | 2 refills | Status: DC

## 2021-08-20 DIAGNOSIS — R110 Nausea: Secondary | ICD-10-CM | POA: Diagnosis not present

## 2021-08-20 DIAGNOSIS — G43719 Chronic migraine without aura, intractable, without status migrainosus: Secondary | ICD-10-CM | POA: Diagnosis not present

## 2021-08-20 DIAGNOSIS — R4701 Aphasia: Secondary | ICD-10-CM | POA: Diagnosis not present

## 2021-08-20 DIAGNOSIS — R4189 Other symptoms and signs involving cognitive functions and awareness: Secondary | ICD-10-CM | POA: Diagnosis not present

## 2021-08-29 ENCOUNTER — Encounter: Payer: Self-pay | Admitting: Family Medicine

## 2021-09-03 ENCOUNTER — Telehealth: Payer: Self-pay | Admitting: Family Medicine

## 2021-09-03 NOTE — Telephone Encounter (Signed)
Patient notified via MyChart.  See message from 08/29/21.

## 2021-09-03 NOTE — Telephone Encounter (Signed)
Vicki Yang from [facility] pharmacy called in about trying to get Rimegepant Sulfate (NURTEC) 75 MG TBDP  medicine to patient, but pt didn't know whow they were and why they were sending he this medicine. Please call pt about this.

## 2021-09-14 ENCOUNTER — Other Ambulatory Visit: Payer: Self-pay | Admitting: Family Medicine

## 2021-09-14 DIAGNOSIS — Z3041 Encounter for surveillance of contraceptive pills: Secondary | ICD-10-CM

## 2021-09-15 ENCOUNTER — Telehealth: Payer: Self-pay | Admitting: Family Medicine

## 2021-09-15 NOTE — Telephone Encounter (Signed)
Requested medications are due for refill today.  yes  Requested medications are on the active medications list.  yes  Last refill. 05/21/2021 #30 2 refills  Future visit scheduled.   no  Notes to clinic.  Rx expired 08/19/2021. Pharmacy needs Dx code.    Requested Prescriptions  Pending Prescriptions Disp Refills   LO LOESTRIN FE 1 MG-10 MCG / 10 MCG tablet [Pharmacy Med Name: LO LOESTRIN FE 1-10 TABLET] 84 tablet 1    Sig: TAKE 1 TABLET BY MOUTH EVERY DAY     OB/GYN:  Contraceptives Passed - 09/14/2021 10:45 AM      Passed - Last BP in normal range    BP Readings from Last 1 Encounters:  06/18/21 102/68          Passed - Valid encounter within last 12 months    Recent Outpatient Visits           2 months ago Annual physical exam   [facility] Jerrol Banana, MD   3 months ago PCOS (polycystic ovarian syndrome)   [facility] Jerrol Banana, MD              Passed - Patient is not a smoker

## 2021-09-15 NOTE — Telephone Encounter (Signed)
Copied from CRM [id number]. Topic: General - Other >> Sep 15, 2021  2:11 PM Gaetana Michaelis A wrote: Reason for CRM: [facility] called regarding a prior authorization for the patient's prescription for Rimegepant Sulfate (NURTEC) 75 MG TBDP [[id number]]   Case ID [id number]  Please contact further when possible

## 2021-09-16 NOTE — Telephone Encounter (Signed)
Please contact patient to schedule follow-up with Dr. Ashley Royalty anytime this month.  Should be 40 minutes per Dr. Ashley Royalty.

## 2021-09-16 NOTE — Telephone Encounter (Signed)
Noted! Thank you

## 2021-09-16 NOTE — Telephone Encounter (Signed)
Refill if appropriate.  Please advise.  

## 2021-09-17 NOTE — Telephone Encounter (Signed)
Spoke with Thayer Ohm with [employer] and provided additional information as requested.  Pending decision.

## 2021-09-19 ENCOUNTER — Other Ambulatory Visit: Payer: Self-pay | Admitting: Family Medicine

## 2021-09-19 DIAGNOSIS — Z3041 Encounter for surveillance of contraceptive pills: Secondary | ICD-10-CM

## 2021-09-19 MED ORDER — LO LOESTRIN FE 1 MG-10 MCG / 10 MCG PO TABS: 1.0000 | ORAL_TABLET | Freq: Every day | ORAL | 0 refills | Status: DC

## 2021-09-19 NOTE — Telephone Encounter (Signed)
Requested medication (s) are due for refill today: Yes  Requested medication (s) are on the active medication list: Yes  Last refill:  05/21/21  Future visit scheduled: Yes  Notes to clinic:  Unable to refill per protocol, Rx expired.      Requested Prescriptions  Pending Prescriptions Disp Refills   Norethindrone-Ethinyl Estradiol-Fe Biphas (LO LOESTRIN FE) 1 MG-10 MCG / 10 MCG tablet 30 tablet 2    Sig: Take 1 tablet by mouth daily.     OB/GYN:  Contraceptives Passed - 09/19/2021 12:50 PM      Passed - Last BP in normal range    BP Readings from Last 1 Encounters:  06/18/21 102/68          Passed - Valid encounter within last 12 months    Recent Outpatient Visits           3 months ago Annual physical exam   [facility] Jerrol Banana, MD   4 months ago PCOS (polycystic ovarian syndrome)   [facility] Jerrol Banana, MD       Future Appointments             In 1 week Ashley Royalty Ocie Bob, MD [facility], [facility]            Passed - Patient is not a smoker

## 2021-09-19 NOTE — Telephone Encounter (Signed)
Copied from CRM [id number]. Topic: Quick Communication - Rx Refill/Question >> Sep 19, 2021  8:40 AM Jaquita Rector A wrote: Medication: Norethindrone-Ethinyl Estradiol-Fe Biphas (LO LOESTRIN FE) 1 MG-10 MCG / 10 MCG tablet   Has the patient contacted their pharmacy? Yes.  Patient has 2 tablets left need Rx today please  (Agent: If no, request that the patient contact the pharmacy for the refill. If patient does not wish to contact the pharmacy document the reason why and proceed with request.) (Agent: If yes, when and what did the pharmacy advise?)  Preferred Pharmacy (with phone number or street name): [id number], [place] - [address]  Phone:  [phone] Fax:  [phone]    Has the patient been seen for an appointment in the last year OR does the patient have an upcoming appointment? Yes.    Agent: Please be advised that RX refills may take up to 3 business days. We ask that you follow-up with your pharmacy.

## 2021-09-19 NOTE — Telephone Encounter (Signed)
PA denied for 16 tablets per 30 days, but approved for 15 tablets for 30 days even when specified the medication only comes in an 8-pack that cannot be separated.  Patient can either get the prescription through [facility] or ask neurologist about coverage/alternatives.  Patient aware through MyChart.

## 2021-09-30 DIAGNOSIS — R4189 Other symptoms and signs involving cognitive functions and awareness: Secondary | ICD-10-CM | POA: Diagnosis not present

## 2021-09-30 DIAGNOSIS — R4701 Aphasia: Secondary | ICD-10-CM | POA: Diagnosis not present

## 2021-09-30 DIAGNOSIS — R110 Nausea: Secondary | ICD-10-CM | POA: Diagnosis not present

## 2021-09-30 DIAGNOSIS — G43719 Chronic migraine without aura, intractable, without status migrainosus: Secondary | ICD-10-CM | POA: Diagnosis not present

## 2021-10-02 ENCOUNTER — Other Ambulatory Visit: Payer: Self-pay

## 2021-10-02 ENCOUNTER — Encounter: Payer: Self-pay | Admitting: Family Medicine

## 2021-10-02 ENCOUNTER — Ambulatory Visit: Payer: BC Managed Care – PPO | Admitting: Family Medicine

## 2021-10-02 VITALS — BP 122/82 | HR 84 | Ht 64.0 in | Wt 247.0 lb

## 2021-10-02 DIAGNOSIS — L729 Follicular cyst of the skin and subcutaneous tissue, unspecified: Secondary | ICD-10-CM

## 2021-10-02 DIAGNOSIS — E785 Hyperlipidemia, unspecified: Secondary | ICD-10-CM | POA: Insufficient documentation

## 2021-10-02 DIAGNOSIS — R1011 Right upper quadrant pain: Secondary | ICD-10-CM | POA: Diagnosis not present

## 2021-10-02 DIAGNOSIS — E282 Polycystic ovarian syndrome: Secondary | ICD-10-CM | POA: Diagnosis not present

## 2021-10-02 DIAGNOSIS — R7989 Other specified abnormal findings of blood chemistry: Secondary | ICD-10-CM | POA: Diagnosis not present

## 2021-10-02 DIAGNOSIS — E559 Vitamin D deficiency, unspecified: Secondary | ICD-10-CM | POA: Insufficient documentation

## 2021-10-02 NOTE — Assessment & Plan Note (Signed)
>>  ASSESSMENT AND PLAN FOR LOW SERUM VITAMIN D WRITTEN ON 10/02/2021  1:07 PM BY Jong Rickman, Ocie Bob, MD  Previously noted to be low, did complete 8-week Rx supplementation course, subjectively stated improvement in energy and mood while on this course.  Plan to recheck levels and treat accordingly.  If noted to be borderline low or low again, she would benefit from daily OTC supplementation with or without initial Rx course to bring her levels back into range.  We will await results to make this decision.

## 2021-10-02 NOTE — Patient Instructions (Signed)
-   Obtain fasting labs with orders provided (can have water or black coffee but otherwise no food or drink x 8 hours before labs) ?-Referral coordinator will contact you to schedule follow-up with dermatology ?- Schedule a follow-up with gynecology to discuss PCOS ?- Maintain follow-up with neurology ?- Return for follow-up 06/2022 for annual physical ?

## 2021-10-02 NOTE — Progress Notes (Signed)
?  ? ?Primary Care / Sports Medicine Office Visit ? ?Patient Information:  ?Patient ID: Vicki Yang, female DOB: 1990-09-10 Age: 31 y.o. MRN: [id number]  ? ?Vicki Yang is a pleasant 31 y.o. female presenting with the following: ? ?Chief Complaint  ?Patient presents with  ? Follow-up  ? Hyperlipidemia  ? ? ?Vitals:  ? 10/02/21 1041  ?BP: 122/82  ?Pulse: 84  ?SpO2: 96%  ? ?Vitals:  ? 10/02/21 1041  ?Weight: 247 lb (112 kg)  ?Height: 5\' 4"  (1.626 m)  ? ?Body mass index is 42.4 kg/m?. ? ?No results found.  ? ?Independent interpretation of notes and tests performed by another provider:  ? ?None ? ?Procedures performed:  ? ?None ? ?Pertinent History, Exam, Impression, and Recommendations:  ? ?PCOS (polycystic ovarian syndrome) ?Patient did have follow-up with gynecology however discussion was primarily centered around family-planning in the setting of comorbid PCOS.  She is interested in further evaluation and management of PCOS, in that regard she is amenable to further follow-up with GYN. ? ?Low serum vitamin D ?Previously noted to be low, did complete 8-week Rx supplementation course, subjectively stated improvement in energy and mood while on this course.  Plan to recheck levels and treat accordingly.  If noted to be borderline low or low again, she would benefit from daily OTC supplementation with or without initial Rx course to bring her levels back into range.  We will await results to make this decision. ? ?Hyperlipidemia ?Noted on recent serum studies to have elevated lipid readings, this is in the setting of comorbid PCOS.  I discussed the relationship between PCOS and hyperlipidemia.  She is tolerating recently prescribed statin well, plan for recheck of labs to assess interval response.  Additionally, I have encouraged her to maintain follow-up with gynecology for further evaluation management of her PCOS. ? ?Right upper quadrant abdominal pain ?Chronic condition in the setting of cholecystectomy  dated 2021, random in frequency, not tied to any specific aggravating or alleviating factors such as food intake, physical activity, this pain is nonradiating.  The symptoms did not resolve entirely after cholecystectomy.  At her last visit for annual physical, we ordered KUB x-ray which did reveal significant stool burden, since then she has been working on that and despite this, has had persistent symptomatology. ? ?Abdominal examination today reveals soft, nontender, nondistended abdomen with no hepatosplenomegaly, slightly hypoactive bowel sounds are noted. ? ?Differential diagnosis can be retained stool burden, possible scar tissue from prior cholecystectomy, hepatic pain, among other differentials.  Plan for serum studies, will include pancreas labs, she is to continue bowel regimen.  Plan to reach out to patient once results are in. ? ?Chronic condition, symptomatic ? ?Scalp cyst ?Patient has incidentally noted left lower scalp at the neck/hairline ongoing for roughly 2 years, focal, nonpainful, nontender, has been stable in size over these past 2 years, no drainage of liquids, no similar episodes of this in the past or elsewhere on the body. ? ?Examination reveals flesh-colored roughly 1 x 1 mm papular nodule region at the left upper neck at the hair/scalp line, findings most consistent with possible epidermoid cyst, this area is nontender, nonfluctuant. ? ?I have advised patient on next steps, she is amenable to further evaluation by dermatology for further evaluation and management.  We will follow peripherally on this issue. ?  ? ?Orders & Medications ?No orders of the defined types were placed in this encounter. ? ?Orders Placed This Encounter  ?Procedures  ? Apo A1 +  B + Ratio  ? Lipid panel  ? VITAMIN D 25 Hydroxy (Vit-D Deficiency, Fractures)  ? Comprehensive metabolic panel  ? Lipase  ? Amylase  ? Ambulatory referral to Obstetrics / Gynecology  ? Ambulatory referral to Dermatology  ?  ? ?Return in  about 9 months (around 06/22/2022) for annual physical.  ?  ? ?Jerrol Banana, MD ? ? Primary Care Sports Medicine ?[facility] ?[facility]  ? ?

## 2021-10-02 NOTE — Assessment & Plan Note (Signed)
Patient did have follow-up with gynecology however discussion was primarily centered around family-planning in the setting of comorbid PCOS.  She is interested in further evaluation and management of PCOS, in that regard she is amenable to further follow-up with GYN. ?

## 2021-10-02 NOTE — Assessment & Plan Note (Signed)
Patient has incidentally noted left lower scalp at the neck/hairline ongoing for roughly 2 years, focal, nonpainful, nontender, has been stable in size over these past 2 years, no drainage of liquids, no similar episodes of this in the past or elsewhere on the body. ? ?Examination reveals flesh-colored roughly 1 x 1 mm papular nodule region at the left upper neck at the hair/scalp line, findings most consistent with possible epidermoid cyst, this area is nontender, nonfluctuant. ? ?I have advised patient on next steps, she is amenable to further evaluation by dermatology for further evaluation and management.  We will follow peripherally on this issue. ? ?

## 2021-10-02 NOTE — Assessment & Plan Note (Signed)
Chronic condition in the setting of cholecystectomy dated 2021, random in frequency, not tied to any specific aggravating or alleviating factors such as food intake, physical activity, this pain is nonradiating.  The symptoms did not resolve entirely after cholecystectomy.  At her last visit for annual physical, we ordered KUB x-ray which did reveal significant stool burden, since then she has been working on that and despite this, has had persistent symptomatology. ? ?Abdominal examination today reveals soft, nontender, nondistended abdomen with no hepatosplenomegaly, slightly hypoactive bowel sounds are noted. ? ?Differential diagnosis can be retained stool burden, possible scar tissue from prior cholecystectomy, hepatic pain, among other differentials.  Plan for serum studies, will include pancreas labs, she is to continue bowel regimen.  Plan to reach out to patient once results are in. ? ?Chronic condition, symptomatic ?

## 2021-10-02 NOTE — Assessment & Plan Note (Signed)
Noted on recent serum studies to have elevated lipid readings, this is in the setting of comorbid PCOS.  I discussed the relationship between PCOS and hyperlipidemia.  She is tolerating recently prescribed statin well, plan for recheck of labs to assess interval response.  Additionally, I have encouraged her to maintain follow-up with gynecology for further evaluation management of her PCOS. ?

## 2021-10-02 NOTE — Assessment & Plan Note (Signed)
Previously noted to be low, did complete 8-week Rx supplementation course, subjectively stated improvement in energy and mood while on this course.  Plan to recheck levels and treat accordingly.  If noted to be borderline low or low again, she would benefit from daily OTC supplementation with or without initial Rx course to bring her levels back into range.  We will await results to make this decision. ?

## 2021-10-16 ENCOUNTER — Other Ambulatory Visit: Payer: Self-pay | Admitting: Family Medicine

## 2021-10-16 MED ORDER — PROPRANOLOL HCL 20 MG PO TABS: 20.0000 mg | ORAL_TABLET | Freq: Two times a day (BID) | ORAL | 1 refills | Status: DC

## 2021-10-27 ENCOUNTER — Encounter: Payer: Self-pay | Admitting: Family Medicine

## 2021-10-27 NOTE — Telephone Encounter (Signed)
Please advise 

## 2021-11-27 ENCOUNTER — Telehealth: Payer: Self-pay | Admitting: Family Medicine

## 2021-11-27 DIAGNOSIS — G43719 Chronic migraine without aura, intractable, without status migrainosus: Secondary | ICD-10-CM | POA: Diagnosis not present

## 2021-11-27 DIAGNOSIS — R4189 Other symptoms and signs involving cognitive functions and awareness: Secondary | ICD-10-CM | POA: Diagnosis not present

## 2021-11-27 DIAGNOSIS — R110 Nausea: Secondary | ICD-10-CM | POA: Diagnosis not present

## 2021-11-27 DIAGNOSIS — R4701 Aphasia: Secondary | ICD-10-CM | POA: Diagnosis not present

## 2021-11-27 NOTE — Telephone Encounter (Signed)
Called pt let her know that we have samples she can pick up. Pt verbalized understanding. ? ?KP ?

## 2021-11-27 NOTE — Telephone Encounter (Signed)
Pt stated her medication, Rimegepant Sulfate (NURTEC) 75 MG TBDP was mailed to her, but it was lost in the mail. ? ?Pt wants to know if PCP has samples she could possibly pick up. The pharmacy is sending medication again, but she is completely out of her medication. ?Pt requesting a call back.  ?

## 2021-11-28 ENCOUNTER — Encounter: Payer: BC Managed Care – PPO | Admitting: Obstetrics

## 2021-12-04 ENCOUNTER — Ambulatory Visit: Payer: BC Managed Care – PPO | Admitting: Obstetrics

## 2021-12-04 ENCOUNTER — Encounter: Payer: Self-pay | Admitting: Obstetrics

## 2021-12-04 VITALS — BP 109/73 | HR 76 | Ht 64.0 in | Wt 244.1 lb

## 2021-12-04 DIAGNOSIS — E282 Polycystic ovarian syndrome: Secondary | ICD-10-CM

## 2021-12-04 MED ORDER — NORETHIN-ETH ESTRAD-FE BIPHAS 1 MG-10 MCG / 10 MCG PO TABS: 1.0000 | ORAL_TABLET | Freq: Every day | ORAL | 3 refills | Status: DC

## 2021-12-04 NOTE — Progress Notes (Signed)
GYN ENCOUNTER ? ?Subjective ? ?HPI: Vicki Yang is a 31 y.o. G0P0000 who presents today to discuss ongoing management of her PCOS. Vicki Yang has been on OCPs almost continuously since approximately age 36. She had a copper IUD for about 9 months, but she resumed OCPs due to excessive bleeding and IUD expulsion. She is pleased with her bleeding profile on OCPs, and her acne, hirsutism, and brittle hair have all improved. She does have migraines without aura that seem related to the pill. She is being followed by neurology. She also reports absent libido. She states that her libido is low at baseline and is essentially non-existent on OCPs. She is not currently interested in pregnancy and is unsure if she will desire a pregnancy in the future. ? ? ? ?Past Medical History:  ?Diagnosis Date  ? Allergy   ? Anxiety   ? Broken wrist   ? bilateral  ? Depression   ? GERD (gastroesophageal reflux disease)   ? Migraine   ? PCOS (polycystic ovarian syndrome)   ? ?Past Surgical History:  ?Procedure Laterality Date  ? CHOLECYSTECTOMY  12/2019  ? ?OB History   ? ? Gravida  ?0  ? Para  ?0  ? Term  ?0  ? Preterm  ?0  ? AB  ?0  ? Living  ?0  ?  ? ? SAB  ?0  ? IAB  ?0  ? Ectopic  ?0  ? Multiple  ?0  ? Live Births  ?0  ?   ?  ?  ? ?Allergies  ?Allergen Reactions  ? Cefoperazone Rash  ? Sulfa Antibiotics Rash  ? ? ? ?Negative except as noted in HPI ?History obtained from the patient ? ?Objective ? ?BP 109/73   Pulse 76   Ht 5\' 4"  (1.626 m)   Wt 244 lb 1.6 oz (110.7 kg)   LMP  (LMP Unknown)   BMI 41.90 kg/m?  ? ?Not indicated for consult today ? ?Assessment ?Evaluation of current PCOS management ? ?Plan ?We thoroughly discussed Vicki Yang's concerns and how her PCOS has been managed in the past. We discussed her perception of the benefits of continuing estrogen-containing OCPs despite the side effects. Reviewed the option of switching to a progestin-only IUD such as Mirena and treating hirsutism and acne separately. Discussed  recommendations if she does desire a pregnancy in the future. Encouraged regular exercise. ? ?Return for annual visit or PRN. ? ? , CNM ? ? ?  ?

## 2021-12-09 ENCOUNTER — Other Ambulatory Visit: Payer: Self-pay | Admitting: Family Medicine

## 2021-12-10 NOTE — Telephone Encounter (Signed)
Refused the Lo Loestrin FE because it was sent to the wrong provider for refill.   This is prescribed by Lloyd Huger, CNM with [facility] not Dr. Rosette Reveal with [facility]. ?

## 2021-12-29 ENCOUNTER — Other Ambulatory Visit: Payer: Self-pay | Admitting: Family Medicine

## 2021-12-29 DIAGNOSIS — F32A Anxiety disorder, unspecified: Secondary | ICD-10-CM

## 2021-12-30 MED ORDER — BUPROPION HCL ER (SR) 100 MG PO TB12: 200.0000 mg | ORAL_TABLET | Freq: Every day | ORAL | 2 refills | Status: DC

## 2021-12-30 NOTE — Telephone Encounter (Signed)
Ok to refill 

## 2022-01-15 ENCOUNTER — Other Ambulatory Visit: Payer: Self-pay | Admitting: Family Medicine

## 2022-01-15 DIAGNOSIS — F419 Anxiety disorder, unspecified: Secondary | ICD-10-CM

## 2022-01-15 NOTE — Telephone Encounter (Signed)
Requested Prescriptions  Pending Prescriptions Disp Refills  . propranolol (INDERAL) 20 MG tablet [Pharmacy Med Name: PROPRANOLOL 20 MG TABLET] 180 tablet     Sig: TAKE 1 TABLET BY MOUTH TWICE A DAY     Cardiovascular:  Beta Blockers Passed - 01/15/2022  1:58 AM      Passed - Last BP in normal range    BP Readings from Last 1 Encounters:  12/04/21 109/73         Passed - Last Heart Rate in normal range    Pulse Readings from Last 1 Encounters:  12/04/21 76         Passed - Valid encounter within last 6 months    Recent Outpatient Visits          3 months ago Scalp cyst   [facility] Jerrol Banana, MD   7 months ago Annual physical exam   [facility] Jerrol Banana, MD   7 months ago PCOS (polycystic ovarian syndrome)   [facility] Jerrol Banana, MD      Future Appointments            In 5 months Ashley Royalty, Ocie Bob, MD [facility], [facility]

## 2022-02-09 ENCOUNTER — Telehealth: Payer: Self-pay | Admitting: Obstetrics

## 2022-02-09 ENCOUNTER — Other Ambulatory Visit: Payer: Self-pay | Admitting: Family Medicine

## 2022-02-09 DIAGNOSIS — G43709 Chronic migraine without aura, not intractable, without status migrainosus: Secondary | ICD-10-CM

## 2022-02-09 NOTE — Telephone Encounter (Signed)
If patient calls back please let her know she has refills at the pharmacy for her birth control

## 2022-02-09 NOTE — Telephone Encounter (Signed)
Patient called to schedule for an annual exam. Patient has been placed on recall list for September. Pt is asking for RX on BC to be sent to pharmacy to cover until able to be seen in September. Please advise.

## 2022-02-10 NOTE — Telephone Encounter (Signed)
Requested medication (s) are due for refill today: yes  Requested medication (s) are on the active medication list: yes  Last refill:  08/15/21 16 tabs 2 RF  Future visit scheduled: yes  Notes to clinic:  med not assigned to a protocol   Requested Prescriptions  Pending Prescriptions Disp Refills   NURTEC 75 MG TBDP [Pharmacy Med Name: NURTEC ODT 75 MG TABLET] 16 tablet 2    Sig: Take 75 mg by mouth every other day.     Off-Protocol Failed - 02/09/2022  8:16 AM      Failed - Medication not assigned to a protocol, review manually.      Passed - Valid encounter within last 12 months    Recent Outpatient Visits           4 months ago Scalp cyst   [facility] Jerrol Banana, MD   7 months ago Annual physical exam   [facility] Jerrol Banana, MD   8 months ago PCOS (polycystic ovarian syndrome)   [facility] Jerrol Banana, MD       Future Appointments             In 5 months Ashley Royalty, Ocie Bob, MD [facility], [facility]

## 2022-02-24 DIAGNOSIS — G43719 Chronic migraine without aura, intractable, without status migrainosus: Secondary | ICD-10-CM | POA: Diagnosis not present

## 2022-02-24 DIAGNOSIS — R110 Nausea: Secondary | ICD-10-CM | POA: Diagnosis not present

## 2022-02-24 DIAGNOSIS — R4189 Other symptoms and signs involving cognitive functions and awareness: Secondary | ICD-10-CM | POA: Diagnosis not present

## 2022-02-24 DIAGNOSIS — R4701 Aphasia: Secondary | ICD-10-CM | POA: Diagnosis not present

## 2022-02-25 DIAGNOSIS — F849 Pervasive developmental disorder, unspecified: Secondary | ICD-10-CM | POA: Diagnosis not present

## 2022-03-09 ENCOUNTER — Other Ambulatory Visit: Payer: Self-pay | Admitting: Family Medicine

## 2022-03-09 DIAGNOSIS — F419 Anxiety disorder, unspecified: Secondary | ICD-10-CM

## 2022-03-10 NOTE — Telephone Encounter (Signed)
Requested medication (s) are due for refill today: no  Requested medication (s) are on the active medication list: yes  Last refill:  12/30/21 #60 2 RF  Future visit scheduled: yes  Notes to clinic:  pharmacy requesting 90 day refills- sent back for provider to review if ok for 90 day refill   Requested Prescriptions  Pending Prescriptions Disp Refills   buPROPion ER (WELLBUTRIN SR) 100 MG 12 hr tablet [Pharmacy Med Name: BUPROPION HCL SR 100 MG TABLET] 180 tablet 1    Sig: TAKE 2 TABLETS BY MOUTH EVERY DAY     Psychiatry: Antidepressants - bupropion Passed - 03/09/2022  1:31 PM      Passed - Cr in normal range and within 360 days    Creatinine, Ser  Date Value Ref Range Status  06/18/2021 0.79 0.57 - 1.00 mg/dL Final         Passed - AST in normal range and within 360 days    AST  Date Value Ref Range Status  06/18/2021 19 0 - 40 IU/L Final         Passed - ALT in normal range and within 360 days    ALT  Date Value Ref Range Status  06/18/2021 19 0 - 32 IU/L Final         Passed - Completed PHQ-2 or PHQ-9 in the last 360 days      Passed - Last BP in normal range    BP Readings from Last 1 Encounters:  12/04/21 109/73         Passed - Valid encounter within last 6 months    Recent Outpatient Visits           5 months ago Scalp cyst   [facility] Jerrol Banana, MD   8 months ago Annual physical exam   [facility] Jerrol Banana, MD   9 months ago PCOS (polycystic ovarian syndrome)   [facility] Jerrol Banana, MD       Future Appointments             In 4 months Ashley Royalty, Ocie Bob, MD [facility], [facility]

## 2022-03-10 NOTE — Telephone Encounter (Signed)
Please advise 

## 2022-03-17 ENCOUNTER — Other Ambulatory Visit: Payer: Self-pay | Admitting: Family Medicine

## 2022-03-17 DIAGNOSIS — F32A Depression, unspecified: Secondary | ICD-10-CM

## 2022-03-18 NOTE — Telephone Encounter (Signed)
Requested Prescriptions  Pending Prescriptions Disp Refills  . buPROPion ER (WELLBUTRIN SR) 100 MG 12 hr tablet [Pharmacy Med Name: BUPROPION HCL SR 100 MG TABLET] 180 tablet 2    Sig: TAKE 2 TABLETS BY MOUTH EVERY DAY     Psychiatry: Antidepressants - bupropion Passed - 03/17/2022  8:44 PM      Passed - Cr in normal range and within 360 days    Creatinine, Ser  Date Value Ref Range Status  06/18/2021 0.79 0.57 - 1.00 mg/dL Final         Passed - AST in normal range and within 360 days    AST  Date Value Ref Range Status  06/18/2021 19 0 - 40 IU/L Final         Passed - ALT in normal range and within 360 days    ALT  Date Value Ref Range Status  06/18/2021 19 0 - 32 IU/L Final         Passed - Completed PHQ-2 or PHQ-9 in the last 360 days      Passed - Last BP in normal range    BP Readings from Last 1 Encounters:  12/04/21 109/73         Passed - Valid encounter within last 6 months    Recent Outpatient Visits          5 months ago Scalp cyst   [place] [facility], Ocie Bob, MD   9 months ago Annual physical exam   [facility] at [facility], Ocie Bob, MD   10 months ago PCOS (polycystic ovarian syndrome)   [place] [facility] at [facility], Ocie Bob, MD      Future Appointments            In 3 months Ashley Royalty, Ocie Bob, MD [facility] Care [facility] Medicine at [facility], [facility]

## 2022-04-02 ENCOUNTER — Telehealth: Payer: Self-pay | Admitting: Family Medicine

## 2022-04-02 ENCOUNTER — Other Ambulatory Visit: Payer: Self-pay

## 2022-04-02 NOTE — Telephone Encounter (Signed)
Copied from CRM [id number]. Topic: Appointment Scheduling - Scheduling Inquiry for Clinic >> Apr 02, 2022  3:48 PM Dondra Prader E wrote: Reason for CRM: Pt has a medication that needs to be injected from her Neurologist. She wants to know if she can bring it to the office and have a nurse help her administer the injection.   Best contact: [phone]

## 2022-04-03 NOTE — Telephone Encounter (Signed)
Spoke to pt let her know that if we did not prescribe the medication we cant administer it. Told pt to call the provider who ordered the medication. Pt verbalized understanding.  KP

## 2022-04-07 DIAGNOSIS — G43719 Chronic migraine without aura, intractable, without status migrainosus: Secondary | ICD-10-CM | POA: Diagnosis not present

## 2022-04-13 ENCOUNTER — Other Ambulatory Visit: Payer: Self-pay | Admitting: Family Medicine

## 2022-04-13 DIAGNOSIS — F419 Anxiety disorder, unspecified: Secondary | ICD-10-CM

## 2022-04-14 NOTE — Telephone Encounter (Signed)
Requested Prescriptions  Pending Prescriptions Disp Refills  . propranolol (INDERAL) 20 MG tablet [Pharmacy Med Name: PROPRANOLOL 20 MG TABLET] 180 tablet 0    Sig: TAKE 1 TABLET BY MOUTH TWICE A DAY     Cardiovascular:  Beta Blockers Failed - 04/13/2022  2:25 AM      Failed - Valid encounter within last 6 months    Recent Outpatient Visits          6 months ago Scalp cyst   [place] [facility], Ocie Bob, MD   10 months ago Annual physical exam   [facility] at [facility], Ocie Bob, MD   10 months ago PCOS (polycystic ovarian syndrome)   [place] [facility] at [facility], Ocie Bob, MD      Future Appointments            In 2 months Ashley Royalty Ocie Bob, MD [facility] and [facility] Medicine at [facility], [facility]           Passed - Last BP in normal range    BP Readings from Last 1 Encounters:  12/04/21 109/73         Passed - Last Heart Rate in normal range    Pulse Readings from Last 1 Encounters:  12/04/21 76

## 2022-06-11 ENCOUNTER — Other Ambulatory Visit: Payer: Self-pay | Admitting: Family Medicine

## 2022-06-11 DIAGNOSIS — E282 Polycystic ovarian syndrome: Secondary | ICD-10-CM

## 2022-06-11 DIAGNOSIS — E7800 Pure hypercholesterolemia, unspecified: Secondary | ICD-10-CM

## 2022-06-11 NOTE — Telephone Encounter (Signed)
Patient has future OV scheduled, will refill medication.  Requested Prescriptions  Pending Prescriptions Disp Refills   metFORMIN (GLUCOPHAGE-XR) 500 MG 24 hr tablet [Pharmacy Med Name: METFORMIN HCL ER 500 MG TABLET] 90 tablet 0    Sig: TAKE 1 TABLET BY MOUTH EVERY DAY WITH BREAKFAST     Endocrinology:  Diabetes - Biguanides Failed - 06/11/2022  2:37 AM      Failed - HBA1C is between 0 and 7.9 and within 180 days    No results found for: "HGBA1C", "LABA1C"       Failed - B12 Level in normal range and within 720 days    No results found for: "VITAMINB12"       Failed - Valid encounter within last 6 months    Recent Outpatient Visits           8 months ago Scalp cyst   [place] [facility] [place], Earley Abide, MD   11 months ago Annual physical exam   [place] [facility] at [facility], Earley Abide, MD   1 year ago PCOS (polycystic ovarian syndrome)   [place] [facility] at [facility], Earley Abide, MD       Future Appointments             In 4 weeks Zigmund Daniel Earley Abide, MD [facility] Primary Care and Sports Medicine at [facility], [place]            Failed - CBC within normal limits and completed in the last 12 months    WBC  Date Value Ref Range Status  06/18/2021 7.8 3.4 - 10.8 x10E3/uL Final   RBC  Date Value Ref Range Status  06/18/2021 4.74 3.77 - 5.28 x10E6/uL Final   Hemoglobin  Date Value Ref Range Status  06/18/2021 13.7 11.1 - 15.9 g/dL Final   Hematocrit  Date Value Ref Range Status  06/18/2021 41.1 34.0 - 46.6 % Final   MCHC  Date Value Ref Range Status  06/18/2021 33.3 31.5 - 35.7 g/dL Final   [facility]  Date Value Ref Range Status  06/18/2021 28.9 26.6 - 33.0 pg Final   MCV  Date Value Ref Range Status  06/18/2021 87 79 - 97 fL Final   No results found for: "PLTCOUNTKUC", "LABPLAT", "POCPLA" RDW  Date Value Ref Range Status   06/18/2021 12.1 11.7 - 15.4 % Final         Passed - Cr in normal range and within 360 days    Creatinine, Ser  Date Value Ref Range Status  06/18/2021 0.79 0.57 - 1.00 mg/dL Final         Passed - eGFR in normal range and within 360 days    eGFR  Date Value Ref Range Status  06/18/2021 104 >59 mL/min/1.73 Final          rosuvastatin (CRESTOR) 10 MG tablet [Pharmacy Med Name: ROSUVASTATIN CALCIUM 10 MG TAB] 90 tablet 0    Sig: TAKE 1 TABLET BY MOUTH EVERY DAY     Cardiovascular:  Antilipid - Statins 2 Failed - 06/11/2022  2:37 AM      Failed - Lipid Panel in normal range within the last 12 months    Cholesterol, Total  Date Value Ref Range Status  06/18/2021 269 (H) 100 - 199 mg/dL Final   LDL Chol Calc (NIH)  Date Value Ref Range Status  06/18/2021 193 (H) 0 -  99 mg/dL Final   HDL  Date Value Ref Range Status  06/18/2021 51 >39 mg/dL Final   Triglycerides  Date Value Ref Range Status  06/18/2021 138 0 - 149 mg/dL Final         Passed - Cr in normal range and within 360 days    Creatinine, Ser  Date Value Ref Range Status  06/18/2021 0.79 0.57 - 1.00 mg/dL Final         Passed - Patient is not pregnant      Passed - Valid encounter within last 12 months    Recent Outpatient Visits           8 months ago Scalp cyst   [place] [facility] [place], Earley Abide, MD   11 months ago Annual physical exam   [facility] at [facility], Earley Abide, MD   1 year ago PCOS (polycystic ovarian syndrome)   [place] [facility] at [facility], Earley Abide, MD       Future Appointments             In 4 weeks Zigmund Daniel, Earley Abide, MD [place] at [facility], [facility]

## 2022-06-17 ENCOUNTER — Encounter: Payer: Self-pay | Admitting: Family Medicine

## 2022-06-18 NOTE — Telephone Encounter (Signed)
Please advise 

## 2022-07-10 ENCOUNTER — Ambulatory Visit (INDEPENDENT_AMBULATORY_CARE_PROVIDER_SITE_OTHER): Payer: BC Managed Care – PPO | Admitting: Family Medicine

## 2022-07-10 ENCOUNTER — Encounter: Payer: Self-pay | Admitting: Family Medicine

## 2022-07-10 VITALS — BP 126/72 | HR 80 | Ht 64.0 in | Wt 252.0 lb

## 2022-07-10 DIAGNOSIS — F419 Anxiety disorder, unspecified: Secondary | ICD-10-CM | POA: Diagnosis not present

## 2022-07-10 DIAGNOSIS — E7800 Pure hypercholesterolemia, unspecified: Secondary | ICD-10-CM

## 2022-07-10 DIAGNOSIS — Z1159 Encounter for screening for other viral diseases: Secondary | ICD-10-CM

## 2022-07-10 DIAGNOSIS — F32A Depression, unspecified: Secondary | ICD-10-CM

## 2022-07-10 DIAGNOSIS — Z0000 Encounter for general adult medical examination without abnormal findings: Secondary | ICD-10-CM | POA: Diagnosis not present

## 2022-07-10 DIAGNOSIS — Z114 Encounter for screening for human immunodeficiency virus [HIV]: Secondary | ICD-10-CM

## 2022-07-10 DIAGNOSIS — E282 Polycystic ovarian syndrome: Secondary | ICD-10-CM

## 2022-07-10 DIAGNOSIS — E66813 Obesity, class 3: Secondary | ICD-10-CM

## 2022-07-10 DIAGNOSIS — G43901 Migraine, unspecified, not intractable, with status migrainosus: Secondary | ICD-10-CM

## 2022-07-10 DIAGNOSIS — R7989 Other specified abnormal findings of blood chemistry: Secondary | ICD-10-CM

## 2022-07-10 MED ORDER — METFORMIN HCL ER 500 MG PO TB24: ORAL_TABLET | ORAL | 1 refills | Status: DC

## 2022-07-10 MED ORDER — ROSUVASTATIN CALCIUM 10 MG PO TABS: 10.0000 mg | ORAL_TABLET | Freq: Every day | ORAL | 0 refills | Status: DC

## 2022-07-10 MED ORDER — PROPRANOLOL HCL 20 MG PO TABS: 20.0000 mg | ORAL_TABLET | Freq: Two times a day (BID) | ORAL | 0 refills | Status: DC

## 2022-07-10 MED ORDER — BUPROPION HCL ER (SR) 100 MG PO TB12: 200.0000 mg | ORAL_TABLET | Freq: Every evening | ORAL | 1 refills | Status: DC

## 2022-07-10 MED ORDER — RYBELSUS 7 MG PO TABS: 7.0000 mg | ORAL_TABLET | Freq: Every day | ORAL | 0 refills | Status: DC

## 2022-07-12 NOTE — Assessment & Plan Note (Signed)
Chronic without adequate control, following neurology.

## 2022-07-12 NOTE — Assessment & Plan Note (Signed)
Discussed pharmacologic management options given that patient has been working on nonpharmacologic methods. Will utilize semaglutide oral form due to patient preference. Will reevaluate in 1 month.

## 2022-07-12 NOTE — Progress Notes (Signed)
Annual Physical Exam Visit  Patient Information:  Patient ID: Vicki Yang, female DOB: 05-05-1991 Age: 31 y.o. MRN: [id number]   Subjective:   CC: Annual Physical Exam  HPI:  Vicki Yang is here for their annual physical.  I reviewed the past medical history, family history, social history, surgical history, and allergies today and changes were made as necessary.  Please see the problem list section below for additional details.  Past Medical History: Past Medical History:  Diagnosis Date   Allergy    Anxiety    Broken wrist    bilateral   Depression    GERD (gastroesophageal reflux disease)    Migraine    PCOS (polycystic ovarian syndrome)    Past Surgical History: Past Surgical History:  Procedure Laterality Date   CHOLECYSTECTOMY  12/2019   Family History: Family History  Problem Relation Age of Onset   Depression Mother    Hypertension Mother    Ovarian cysts Mother    Migraines Mother    Heart disease Mother    Anxiety disorder Mother    Obesity Mother    Hypertension Father    Diabetes type I Father    Retinal degeneration Father    Diabetes Father    Obesity Father    Mental illness Brother    Anxiety disorder Brother    Asthma Brother    Diabetes Maternal Grandmother    Lymphoma Maternal Grandmother    Cancer Maternal Grandmother    Obesity Maternal Grandmother    Obesity Paternal Grandmother    Ovarian cancer Maternal Great-grandmother    Allergies: Allergies  Allergen Reactions   Cefoperazone Rash   Sulfa Antibiotics Rash   Health Maintenance: Health Maintenance  Topic Date Due   PAP SMEAR-Modifier  Never done   INFLUENZA VACCINE  11/01/2022 (Originally 03/03/2022)   COVID-19 Vaccine (1) 07/26/2026 (Originally 04/21/1991)   Hepatitis C Screening  Completed   HIV Screening  Completed   HPV VACCINES  Aged Out   DTaP/Tdap/Td  Discontinued    HM Colonoscopy     This patient has no relevant Health Maintenance data.       Medications: Current Outpatient Medications on File Prior to Visit  Medication Sig Dispense Refill   AJOVY 225 MG/1.5ML SOAJ Inject into the skin.     butalbital-acetaminophen-caffeine (FIORICET) 50-325-40 MG tablet Take 1 tab at headache onset, can repeat once in 4 hours if needed.  No more than 2 doses in 24 hours     famotidine (PEPCID) 20 MG tablet Take 1 tablet (20 mg total) by mouth 2 (two) times daily. 60 tablet 2   fluticasone (FLONASE) 50 MCG/ACT nasal spray Place 1 spray into both nostrils daily.     Norethindrone-Ethinyl Estradiol-Fe Biphas (LO LOESTRIN FE) 1 MG-10 MCG / 10 MCG tablet Take 1 tablet by mouth daily. 84 tablet 3   NURTEC 75 MG TBDP TAKE 75 MG BY MOUTH EVERY OTHER DAY. 16 tablet 5   No current facility-administered medications on file prior to visit.    Review of Systems: No headache, visual changes, nausea, vomiting, diarrhea, constipation, dizziness, abdominal pain, skin rash, fevers, chills, night sweats, swollen lymph nodes, weight loss, chest pain, body aches, joint swelling, muscle aches, shortness of breath, mood changes, visual or auditory hallucinations reported.  Objective:   Vitals:   07/10/22 1317  BP: 126/72  Pulse: 80  SpO2: 97%   Vitals:   07/10/22 1317  Weight: 252 lb (114.3 kg)  Height: 5\' 4"  (  1.626 m)   Body mass index is 43.26 kg/m.  General: Well Developed, well nourished, and in no acute distress.  Neuro: Alert and oriented x3, extra-ocular muscles intact, sensation grossly intact. Cranial nerves II through XII are grossly intact, motor, sensory, and coordinative functions are intact. HEENT: Normocephalic, atraumatic, pupils equal round reactive to light, neck supple, no masses, no lymphadenopathy, thyroid nonpalpable. Oropharynx, nasopharynx, external ear canals are unremarkable. Skin: Warm and dry, no rashes noted.  Cardiac: Regular rate and rhythm, no murmurs rubs or gallops. No peripheral edema. Pulses symmetric. Respiratory:  Clear to auscultation bilaterally. Not using accessory muscles, speaking in full sentences.  Abdominal: Soft, nontender, nondistended, positive bowel sounds, no masses, no organomegaly. Musculoskeletal: Shoulder, elbow, wrist, hip, knee, ankle stable, and with full range of motion.  Female chaperone initials: KG present throughout the physical examination.  Impression and Recommendations:   The patient was counselled, risk factors were discussed, and anticipatory guidance given.  Problem List Items Addressed This Visit       Cardiovascular and Mediastinum   Migraine    Chronic without adequate control, following neurology.      Relevant Medications   buPROPion ER (WELLBUTRIN SR) 100 MG 12 hr tablet   propranolol (INDERAL) 20 MG tablet   rosuvastatin (CRESTOR) 10 MG tablet     Endocrine   PCOS (polycystic ovarian syndrome)    Chronic condition, has not had a chance to establish with gynecology yet, new referral placed today.      Relevant Medications   metFORMIN (GLUCOPHAGE-XR) 500 MG 24 hr tablet     Other   Obesity, Class III, BMI 40-49.9 (morbid obesity) (HCC)    Discussed pharmacologic management options given that patient has been working on nonpharmacologic methods. Will utilize semaglutide oral form due to patient preference. Will reevaluate in 1 month.      Relevant Medications   metFORMIN (GLUCOPHAGE-XR) 500 MG 24 hr tablet   Semaglutide (RYBELSUS) 7 MG TABS   Anxiety and depression   Relevant Medications   buPROPion ER (WELLBUTRIN SR) 100 MG 12 hr tablet   propranolol (INDERAL) 20 MG tablet   Annual physical exam - Primary   Relevant Orders   CBC   Comprehensive metabolic panel   Hepatitis C antibody   HIV Antibody (routine testing w rflx)   Lipid panel   TSH   Ambulatory referral to Gynecology   VITAMIN D 25 Hydroxy (Vit-D Deficiency, Fractures)   Low serum vitamin D   Relevant Orders   VITAMIN D 25 Hydroxy (Vit-D Deficiency, Fractures)    Hyperlipidemia   Relevant Medications   propranolol (INDERAL) 20 MG tablet   rosuvastatin (CRESTOR) 10 MG tablet   Other Relevant Orders   Comprehensive metabolic panel   Lipid panel   Other Visit Diagnoses     Screening for HIV (human immunodeficiency virus)       Need for hepatitis C screening test            Orders & Medications Medications:  Meds ordered this encounter  Medications   buPROPion ER (WELLBUTRIN SR) 100 MG 12 hr tablet    Sig: Take 2 tablets (200 mg total) by mouth at bedtime.    Dispense:  180 tablet    Refill:  1   metFORMIN (GLUCOPHAGE-XR) 500 MG 24 hr tablet    Sig: TAKE 1 TABLET BY MOUTH EVERY DAY WITH BREAKFAST    Dispense:  90 tablet    Refill:  1   propranolol (INDERAL) 20  MG tablet    Sig: Take 1 tablet (20 mg total) by mouth 2 (two) times daily.    Dispense:  180 tablet    Refill:  0   rosuvastatin (CRESTOR) 10 MG tablet    Sig: Take 1 tablet (10 mg total) by mouth daily.    Dispense:  90 tablet    Refill:  0   Semaglutide (RYBELSUS) 7 MG TABS    Sig: Take 7 mg by mouth daily.    Dispense:  30 tablet    Refill:  0   Orders Placed This Encounter  Procedures   CBC   Comprehensive metabolic panel   Hepatitis C antibody   HIV Antibody (routine testing w rflx)   Lipid panel   TSH   VITAMIN D 25 Hydroxy (Vit-D Deficiency, Fractures)   Ambulatory referral to Gynecology     No follow-ups on file.    Jerrol Banana, MD, [facility]   Primary Care Sports Medicine Primary Care and Sports Medicine at [facility]

## 2022-07-12 NOTE — Assessment & Plan Note (Signed)
Chronic condition, has not had a chance to establish with gynecology yet, new referral placed today.

## 2022-07-16 ENCOUNTER — Encounter: Payer: Self-pay | Admitting: Family Medicine

## 2022-07-16 ENCOUNTER — Telehealth: Payer: Self-pay

## 2022-07-16 ENCOUNTER — Other Ambulatory Visit: Payer: Self-pay | Admitting: Family Medicine

## 2022-07-16 DIAGNOSIS — F419 Anxiety disorder, unspecified: Secondary | ICD-10-CM

## 2022-07-16 NOTE — Telephone Encounter (Signed)
Refilled 07/10/2022 #[id number] rf. Requested Prescriptions  Pending Prescriptions Disp Refills   propranolol (INDERAL) 20 MG tablet [Pharmacy Med Name: PROPRANOLOL 20 MG TABLET] 180 tablet 0    Sig: TAKE 1 TABLET BY MOUTH TWICE A DAY     Cardiovascular:  Beta Blockers Passed - 07/16/2022  1:43 AM      Passed - Last BP in normal range    BP Readings from Last 1 Encounters:  07/10/22 126/72         Passed - Last Heart Rate in normal range    Pulse Readings from Last 1 Encounters:  07/10/22 80         Passed - Valid encounter within last 6 months    Recent Outpatient Visits           6 days ago Annual physical exam   [place] [facility] at [facility], Ocie Bob, MD   9 months ago Scalp cyst   [place] [facility] at [facility], Ocie Bob, MD   1 year ago Annual physical exam   [facility] at [facility], Ocie Bob, MD   1 year ago PCOS (polycystic ovarian syndrome)   [place] [facility] at [facility], Ocie Bob, MD       Future Appointments             In 1 month Ashley Royalty, Ocie Bob, MD [facility] Primary Care and Sports Medicine at [facility]

## 2022-07-16 NOTE — Telephone Encounter (Signed)
PA completed waiting on insurance approval.  Key: Guy Sandifer  KP

## 2022-07-17 NOTE — Telephone Encounter (Signed)
Noted  KP 

## 2022-07-21 NOTE — Telephone Encounter (Signed)
Denied  KP 

## 2022-08-05 DIAGNOSIS — R7989 Other specified abnormal findings of blood chemistry: Secondary | ICD-10-CM | POA: Diagnosis not present

## 2022-08-05 DIAGNOSIS — E7800 Pure hypercholesterolemia, unspecified: Secondary | ICD-10-CM | POA: Diagnosis not present

## 2022-08-05 DIAGNOSIS — D72829 Elevated white blood cell count, unspecified: Secondary | ICD-10-CM | POA: Diagnosis not present

## 2022-08-05 DIAGNOSIS — Z1329 Encounter for screening for other suspected endocrine disorder: Secondary | ICD-10-CM | POA: Diagnosis not present

## 2022-08-05 DIAGNOSIS — E785 Hyperlipidemia, unspecified: Secondary | ICD-10-CM | POA: Diagnosis not present

## 2022-08-05 DIAGNOSIS — E282 Polycystic ovarian syndrome: Secondary | ICD-10-CM | POA: Diagnosis not present

## 2022-08-06 ENCOUNTER — Other Ambulatory Visit: Payer: Self-pay | Admitting: Family Medicine

## 2022-08-06 DIAGNOSIS — R7989 Other specified abnormal findings of blood chemistry: Secondary | ICD-10-CM

## 2022-08-06 LAB — COMPREHENSIVE METABOLIC PANEL
ALT: 31 IU/L (ref 0–32)
AST: 24 IU/L (ref 0–40)
Albumin/Globulin Ratio: 1.8 (ref 1.2–2.2)
Albumin: 4.6 g/dL (ref 4.0–5.0)
Alkaline Phosphatase: 117 IU/L (ref 44–121)
BUN/Creatinine Ratio: 15 (ref 9–23)
BUN: 12 mg/dL (ref 6–20)
Bilirubin Total: 0.5 mg/dL (ref 0.0–1.2)
CO2: 19 mmol/L — ABNORMAL LOW (ref 20–29)
Calcium: 9.9 mg/dL (ref 8.7–10.2)
Chloride: 100 mmol/L (ref 96–106)
Creatinine, Ser: 0.8 mg/dL (ref 0.57–1.00)
Globulin, Total: 2.6 g/dL (ref 1.5–4.5)
Glucose: 82 mg/dL (ref 70–99)
Potassium: 4.7 mmol/L (ref 3.5–5.2)
Sodium: 137 mmol/L (ref 134–144)
Total Protein: 7.2 g/dL (ref 6.0–8.5)
eGFR: 102 mL/min/{1.73_m2} (ref >59)

## 2022-08-06 LAB — CBC
Hematocrit: 43.8 % (ref 34.0–46.6)
Hemoglobin: 14.4 g/dL (ref 11.1–15.9)
MCH: 29.6 pg (ref 26.6–33.0)
MCHC: 32.9 g/dL (ref 31.5–35.7)
MCV: 90 fL (ref 79–97)
Platelets: 353 10*3/uL (ref 150–450)
RBC: 4.86 x10E6/uL (ref 3.77–5.28)
RDW: 12.2 % (ref 11.7–15.4)
WBC: 11.8 10*3/uL — ABNORMAL HIGH (ref 3.4–10.8)

## 2022-08-06 LAB — HEPATITIS C ANTIBODY: Hep C Virus Ab: NONREACTIVE

## 2022-08-06 LAB — LIPID PANEL
Chol/HDL Ratio: 3.6 ratio (ref 0.0–4.4)
Cholesterol, Total: 188 mg/dL (ref 100–199)
HDL: 52 mg/dL (ref >39)
LDL Chol Calc (NIH): 110 mg/dL — ABNORMAL HIGH (ref 0–99)
Triglycerides: 148 mg/dL (ref 0–149)
VLDL Cholesterol Cal: 26 mg/dL (ref 5–40)

## 2022-08-06 LAB — VITAMIN D 25 HYDROXY (VIT D DEFICIENCY, FRACTURES): Vit D, 25-Hydroxy: 29 ng/mL — ABNORMAL LOW (ref 30.0–100.0)

## 2022-08-06 LAB — TSH: TSH: 2.38 u[IU]/mL (ref 0.450–4.500)

## 2022-08-06 LAB — HIV ANTIBODY (ROUTINE TESTING W REFLEX): HIV Screen 4th Generation wRfx: NONREACTIVE

## 2022-08-06 MED ORDER — VITAMIN D (ERGOCALCIFEROL) 1.25 MG (50000 UNIT) PO CAPS: 50000.0000 [IU] | ORAL_CAPSULE | ORAL | 0 refills | Status: DC

## 2022-08-10 NOTE — Telephone Encounter (Signed)
Please advise 

## 2022-08-18 ENCOUNTER — Ambulatory Visit: Payer: BC Managed Care – PPO | Admitting: Family Medicine

## 2022-08-21 ENCOUNTER — Other Ambulatory Visit: Payer: Self-pay | Admitting: Family Medicine

## 2022-08-21 DIAGNOSIS — F32A Depression, unspecified: Secondary | ICD-10-CM

## 2022-08-21 NOTE — Telephone Encounter (Signed)
prescription for

## 2022-08-24 ENCOUNTER — Encounter: Payer: BC Managed Care – PPO | Admitting: Advanced Practice Midwife

## 2022-08-30 ENCOUNTER — Other Ambulatory Visit: Payer: Self-pay | Admitting: Family Medicine

## 2022-08-30 DIAGNOSIS — R7989 Other specified abnormal findings of blood chemistry: Secondary | ICD-10-CM

## 2022-08-31 NOTE — Telephone Encounter (Signed)
Requested medication (s) are due for refill today: no  Requested medication (s) are on the active medication list: yes   Last refill:  08/06/22 #8 0 refills  Future visit scheduled: no   Notes to clinic:  order for 8 total doses. Do you want to continues refills? Pharmacy comment: REQUEST FOR 90 DAYS PRESCRIPTION. DX Code Needed.         Requested Prescriptions  Pending Prescriptions Disp Refills   Vitamin D, Ergocalciferol, (DRISDOL) 1.25 MG (50000 UNIT) CAPS capsule [Pharmacy Med Name: VITAMIN D2 1.25MG (50,000 UNIT)] 12 capsule 1    Sig: Take 1 capsule (50,000 Units total) by mouth every 7 (seven) days. Take for 8 total doses(weeks)     Endocrinology:  Vitamins - Vitamin D Supplementation 2 Failed - 08/30/2022  2:30 PM      Failed - Manual Review: Route requests for 50,000 IU strength to the provider      Failed - Vitamin D in normal range and within 360 days    Vit D, 25-Hydroxy  Date Value Ref Range Status  08/05/2022 29.0 (L) 30.0 - 100.0 ng/mL Final    Comment:    Vitamin D deficiency has been defined by the [place] practice guideline as a level of serum 25-OH vitamin D less than 20 ng/mL (1,2). The Endocrine Society went on to further define vitamin D insufficiency as a level between 21 and 29 ng/mL (2). 1. IOM (Institute of Medicine). 2010. Dietary reference    intakes for calcium and D. [place]: The    [employer]. 2. Holick MF, Binkley [place], Bischoff-Ferrari HA, et al.    Evaluation, treatment, and prevention of vitamin D    deficiency: an Endocrine Society clinical practice    guideline. JCEM. 2011 Jul; 96(7):1911-30.          Passed - Ca in normal range and within 360 days    Calcium  Date Value Ref Range Status  08/05/2022 9.9 8.7 - 10.2 mg/dL Final         Passed - Valid encounter within last 12 months    Recent Outpatient Visits           1 month ago Annual physical exam   [place] [facility] at [facility], Earley Abide, MD   11 months ago Scalp cyst   [place] [facility] [place], Earley Abide, MD   1 year ago Annual physical exam   [place] [facility] [place], Earley Abide, MD   1 year ago PCOS (polycystic ovarian syndrome)   [place] at [facility], Earley Abide, MD

## 2022-09-04 ENCOUNTER — Other Ambulatory Visit: Payer: Self-pay | Admitting: Family Medicine

## 2022-09-04 ENCOUNTER — Encounter: Payer: Self-pay | Admitting: Family Medicine

## 2022-09-04 ENCOUNTER — Other Ambulatory Visit: Payer: Self-pay

## 2022-09-04 DIAGNOSIS — E7800 Pure hypercholesterolemia, unspecified: Secondary | ICD-10-CM

## 2022-09-04 DIAGNOSIS — E282 Polycystic ovarian syndrome: Secondary | ICD-10-CM

## 2022-09-04 MED ORDER — ROSUVASTATIN CALCIUM 10 MG PO TABS: 10.0000 mg | ORAL_TABLET | Freq: Every day | ORAL | 1 refills | Status: DC

## 2022-09-04 MED ORDER — METFORMIN HCL ER 500 MG PO TB24: ORAL_TABLET | ORAL | 1 refills | Status: DC

## 2022-09-04 NOTE — Telephone Encounter (Signed)
Requested Prescriptions  Pending Prescriptions Disp Refills   metFORMIN (GLUCOPHAGE-XR) 500 MG 24 hr tablet [Pharmacy Med Name: METFORMIN HCL ER 500 MG TABLET] 90 tablet 1    Sig: TAKE 1 TABLET BY MOUTH EVERY DAY WITH BREAKFAST     Endocrinology:  Diabetes - Biguanides Failed - 09/04/2022  9:31 AM      Failed - HBA1C is between 0 and 7.9 and within 180 days    No results found for: "HGBA1C", "LABA1C"       Failed - B12 Level in normal range and within 720 days    No results found for: "VITAMINB12"       Failed - CBC within normal limits and completed in the last 12 months    WBC  Date Value Ref Range Status  08/05/2022 11.8 (H) 3.4 - 10.8 x10E3/uL Final   RBC  Date Value Ref Range Status  08/05/2022 4.86 3.77 - 5.28 x10E6/uL Final   Hemoglobin  Date Value Ref Range Status  08/05/2022 14.4 11.1 - 15.9 g/dL Final   Hematocrit  Date Value Ref Range Status  08/05/2022 43.8 34.0 - 46.6 % Final   MCHC  Date Value Ref Range Status  08/05/2022 32.9 31.5 - 35.7 g/dL Final   [facility]  Date Value Ref Range Status  08/05/2022 29.6 26.6 - 33.0 pg Final   MCV  Date Value Ref Range Status  08/05/2022 90 79 - 97 fL Final   No results found for: "PLTCOUNTKUC", "LABPLAT", "POCPLA" RDW  Date Value Ref Range Status  08/05/2022 12.2 11.7 - 15.4 % Final         Passed - Cr in normal range and within 360 days    Creatinine, Ser  Date Value Ref Range Status  08/05/2022 0.80 0.57 - 1.00 mg/dL Final         Passed - eGFR in normal range and within 360 days    eGFR  Date Value Ref Range Status  08/05/2022 102 >59 mL/min/1.73 Final         Passed - Valid encounter within last 6 months    Recent Outpatient Visits           1 month ago Annual physical exam   [place] [facility] at [place], Earley Abide, MD   11 months ago Scalp cyst   [place] [facility] at [place], Earley Abide, MD   1 year ago Annual physical  exam   [facility] Medicine at [facility], Earley Abide, MD   1 year ago PCOS (polycystic ovarian syndrome)   [place] [facility] at [facility], Earley Abide, MD               rosuvastatin (CRESTOR) 10 MG tablet [Pharmacy Med Name: ROSUVASTATIN CALCIUM 10 MG TAB] 90 tablet 1    Sig: TAKE 1 TABLET BY MOUTH EVERY DAY     Cardiovascular:  Antilipid - Statins 2 Failed - 09/04/2022  9:31 AM      Failed - Lipid Panel in normal range within the last 12 months    Cholesterol, Total  Date Value Ref Range Status  08/05/2022 188 100 - 199 mg/dL Final   LDL Chol Calc (NIH)  Date Value Ref Range Status  08/05/2022 110 (H) 0 - 99 mg/dL Final   HDL  Date Value Ref Range Status  08/05/2022 52 >39 mg/dL Final  Triglycerides  Date Value Ref Range Status  08/05/2022 148 0 - 149 mg/dL Final         Passed - Cr in normal range and within 360 days    Creatinine, Ser  Date Value Ref Range Status  08/05/2022 0.80 0.57 - 1.00 mg/dL Final         Passed - Patient is not pregnant      Passed - Valid encounter within last 12 months    Recent Outpatient Visits           1 month ago Annual physical exam    Primary Care & Sports Medicine at [facility], Earley Abide, MD   11 months ago Scalp cyst   [place] at [place], Earley Abide, MD   1 year ago Annual physical exam   [place] at [place], Earley Abide, MD   1 year ago PCOS (polycystic ovarian syndrome)   [place] at [facility], Earley Abide, MD

## 2022-09-21 ENCOUNTER — Other Ambulatory Visit: Payer: Self-pay

## 2022-09-21 ENCOUNTER — Encounter: Payer: BC Managed Care – PPO | Admitting: Advanced Practice Midwife

## 2022-09-21 MED ORDER — NORETHIN-ETH ESTRAD-FE BIPHAS 1 MG-10 MCG / 10 MCG PO TABS: 1.0000 | ORAL_TABLET | Freq: Every day | ORAL | 0 refills | Status: DC

## 2022-09-21 NOTE — Telephone Encounter (Signed)
Patient contacted office requesting refill on birthcontrol until she is able to come in for her appointment on 10/09/22. KW

## 2022-10-09 ENCOUNTER — Ambulatory Visit: Payer: BC Managed Care – PPO | Admitting: Licensed Practical Nurse

## 2022-10-09 ENCOUNTER — Encounter: Payer: Self-pay | Admitting: Licensed Practical Nurse

## 2022-10-09 VITALS — BP 116/78 | HR 80 | Ht 63.0 in | Wt 246.0 lb

## 2022-10-09 DIAGNOSIS — E282 Polycystic ovarian syndrome: Secondary | ICD-10-CM

## 2022-10-09 DIAGNOSIS — F419 Anxiety disorder, unspecified: Secondary | ICD-10-CM

## 2022-10-09 DIAGNOSIS — Z3041 Encounter for surveillance of contraceptive pills: Secondary | ICD-10-CM

## 2022-10-12 ENCOUNTER — Other Ambulatory Visit: Payer: Self-pay | Admitting: Licensed Practical Nurse

## 2022-10-12 DIAGNOSIS — F419 Anxiety disorder, unspecified: Secondary | ICD-10-CM

## 2022-10-12 MED ORDER — LORAZEPAM 2 MG PO TABS: 2.0000 mg | ORAL_TABLET | Freq: Four times a day (QID) | ORAL | 1 refills | Status: DC | PRN

## 2022-10-12 MED ORDER — NORETHIN-ETH ESTRAD-FE BIPHAS 1 MG-10 MCG / 10 MCG PO TABS: 1.0000 | ORAL_TABLET | Freq: Every day | ORAL | 3 refills | Status: DC
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
## 2022-10-13 ENCOUNTER — Other Ambulatory Visit: Payer: Self-pay

## 2022-10-13 ENCOUNTER — Telehealth: Payer: Self-pay

## 2022-10-13 ENCOUNTER — Other Ambulatory Visit: Payer: Self-pay | Admitting: Licensed Practical Nurse

## 2022-10-13 DIAGNOSIS — F419 Anxiety disorder, unspecified: Secondary | ICD-10-CM

## 2022-10-13 MED ORDER — LORAZEPAM 2 MG PO TABS: 2.0000 mg | ORAL_TABLET | ORAL | 0 refills | Status: DC | PRN

## 2022-10-13 NOTE — Telephone Encounter (Signed)
[facility] states they need a new prescription since its a control medication. Please send in Lorazepam (Ativan) with correct directions.

## 2022-11-18 ENCOUNTER — Other Ambulatory Visit: Payer: Self-pay | Admitting: Family Medicine

## 2022-11-18 DIAGNOSIS — F32A Depression, unspecified: Secondary | ICD-10-CM

## 2022-11-18 DIAGNOSIS — F419 Anxiety disorder, unspecified: Secondary | ICD-10-CM

## 2022-11-19 NOTE — Telephone Encounter (Signed)
Requested Prescriptions  Pending Prescriptions Disp Refills   propranolol (INDERAL) 20 MG tablet [Pharmacy Med Name: Propranolol Hydrochloride  Tablet] 180 tablet 1    Sig: Take 1 tablet by mouth twice daily.     Cardiovascular:  Beta Blockers Passed - 11/18/2022 12:32 PM      Passed - Last BP in normal range    BP Readings from Last 1 Encounters:  10/09/22 116/78         Passed - Last Heart Rate in normal range    Pulse Readings from Last 1 Encounters:  10/09/22 80         Passed - Valid encounter within last 6 months    Recent Outpatient Visits           4 months ago Annual physical exam   [place] [facility] at [facility], Ocie Bob, MD   1 year ago Scalp cyst   [facility] at [facility], Ocie Bob, MD   1 year ago Annual physical exam   [facility] at [facility], Ocie Bob, MD   1 year ago PCOS (polycystic ovarian syndrome)   [place] [facility] at [facility], Ocie Bob, MD

## 2023-01-10 ENCOUNTER — Other Ambulatory Visit: Payer: Self-pay | Admitting: Family Medicine

## 2023-01-10 DIAGNOSIS — F32A Depression, unspecified: Secondary | ICD-10-CM

## 2023-01-12 NOTE — Telephone Encounter (Signed)
Requested Prescriptions  Pending Prescriptions Disp Refills   buPROPion ER (WELLBUTRIN SR) 100 MG 12 hr tablet [Pharmacy Med Name: Bupropion Hydrochloride 100mg  Extended-Release (SR) Tablet] 180 tablet 1    Sig: Take 2 tablets by mouth at bedtime.     Psychiatry: Antidepressants - bupropion Failed - 01/10/2023  3:32 PM      Failed - Valid encounter within last 6 months    Recent Outpatient Visits           6 months ago Annual physical exam   [place] [facility] at [facility], Ocie Bob, MD   1 year ago Scalp cyst   [place] [facility] at [facility], Ocie Bob, MD   1 year ago Annual physical exam   [facility] & [facility] Medicine at [facility], Ocie Bob, MD   1 year ago PCOS (polycystic ovarian syndrome)   [place] [facility] at [facility], Ocie Bob, MD       Future Appointments             In 2 weeks Ashley Royalty, Ocie Bob, MD [facility] [place] [facility] Primary Care & Sports Medicine at [facility]            Passed - Cr in normal range and within 360 days    Creatinine, Ser  Date Value Ref Range Status  08/05/2022 0.80 0.57 - 1.00 mg/dL Final         Passed - AST in normal range and within 360 days    AST  Date Value Ref Range Status  08/05/2022 24 0 - 40 IU/L Final         Passed - ALT in normal range and within 360 days    ALT  Date Value Ref Range Status  08/05/2022 31 0 - 32 IU/L Final         Passed - Completed PHQ-2 or PHQ-9 in the last 360 days      Passed - Last BP in normal range    BP Readings from Last 1 Encounters:  10/09/22 116/78

## 2023-01-12 NOTE — Telephone Encounter (Signed)
Called pt made appt 

## 2023-01-22 IMAGING — CR DG ABDOMEN 1V
4 series · 4 of 4 positions shown · non-contrast
Comparison: None.

CLINICAL DATA: Right upper quadrant pain.

EXAM:
ABDOMEN - 1 VIEW

[abdomen kub (1 of 4)]
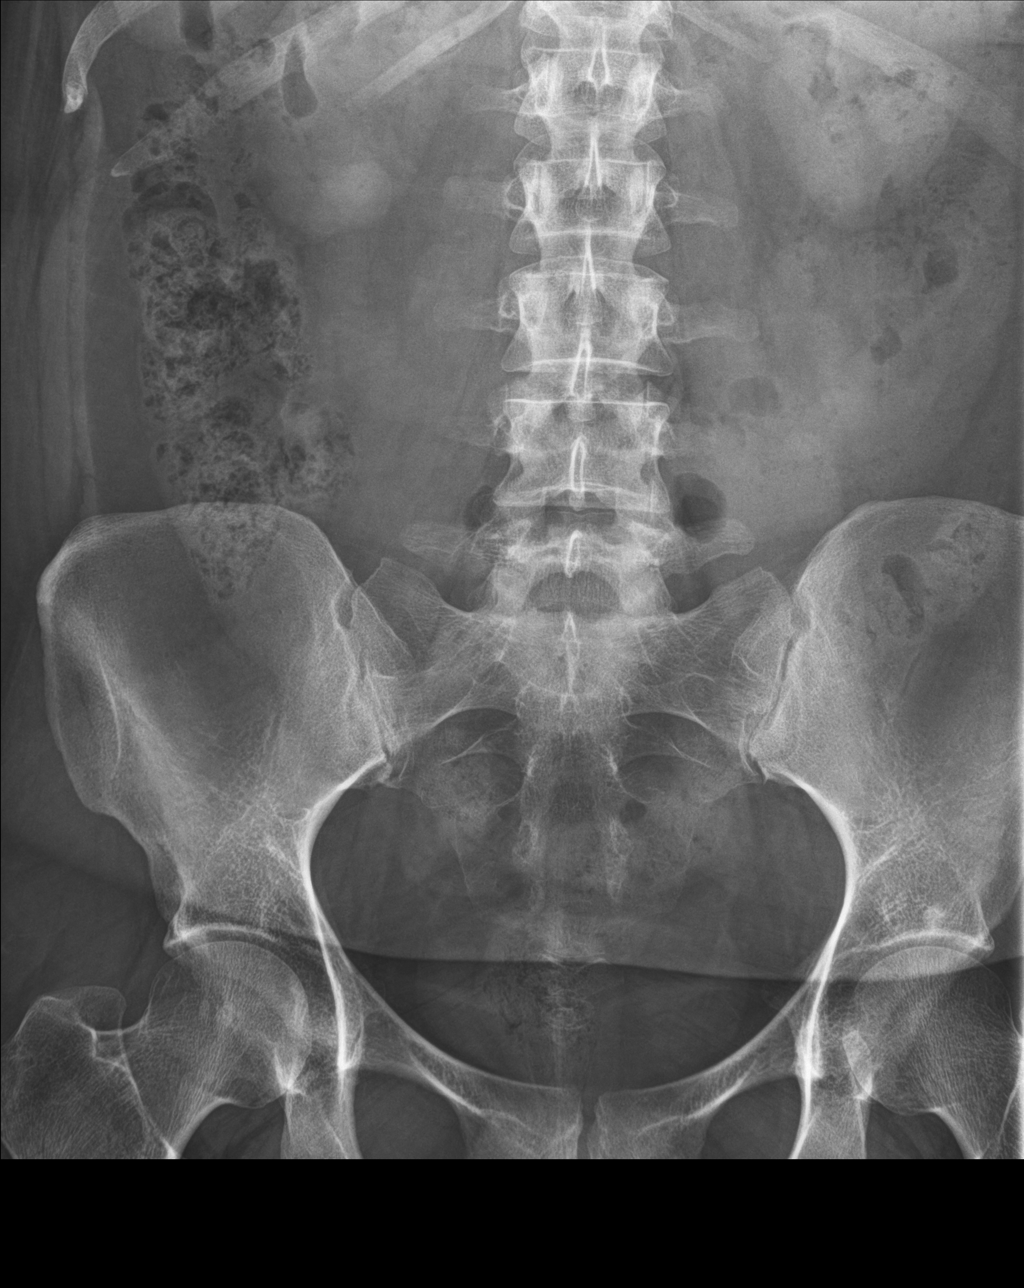

[abdomen kub (2 of 4)]
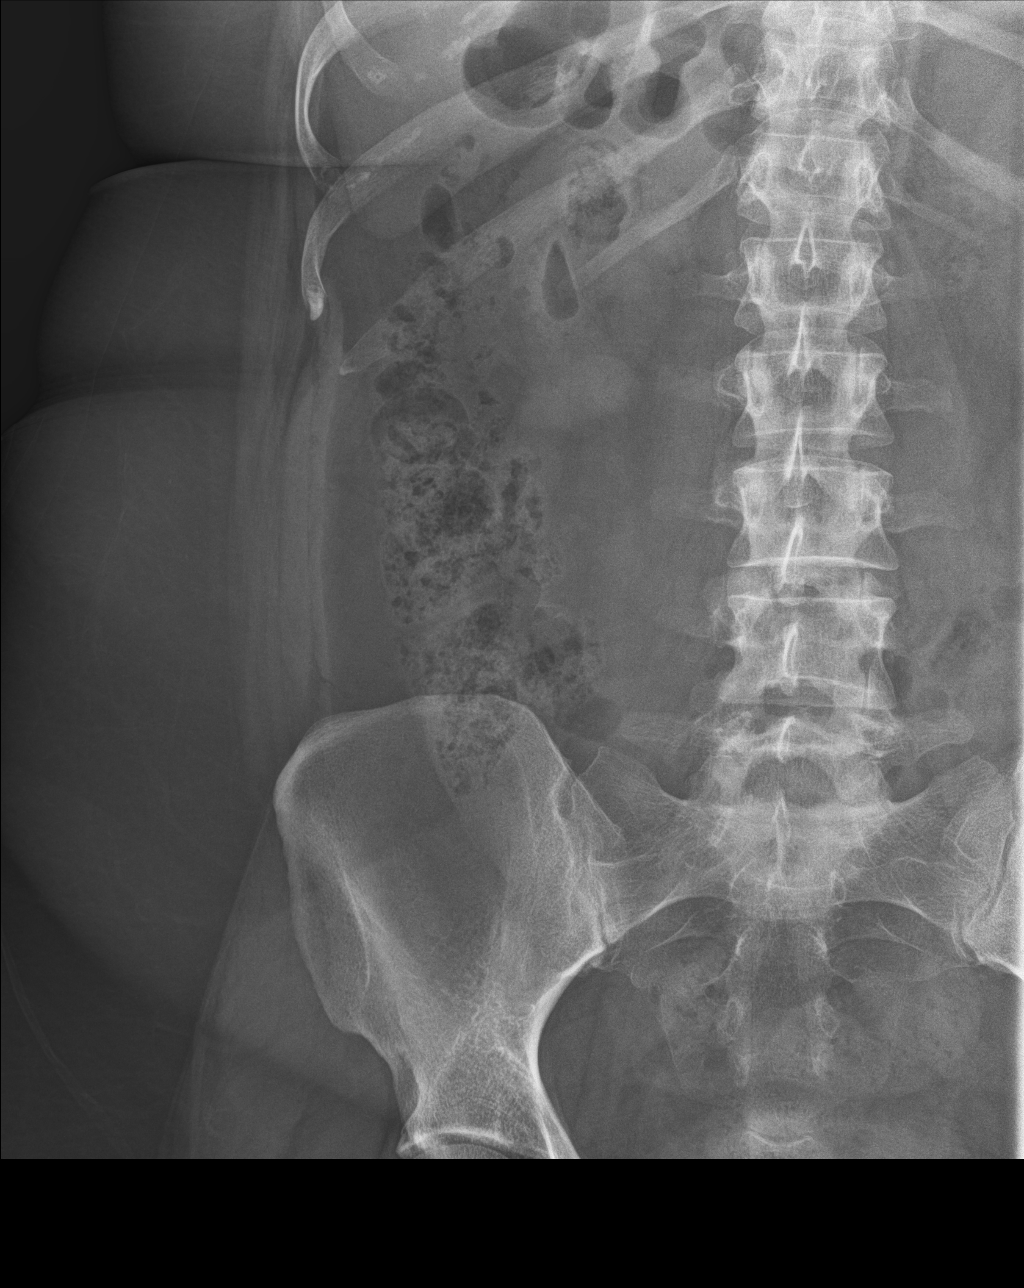

[abdomen kub (3 of 4)]
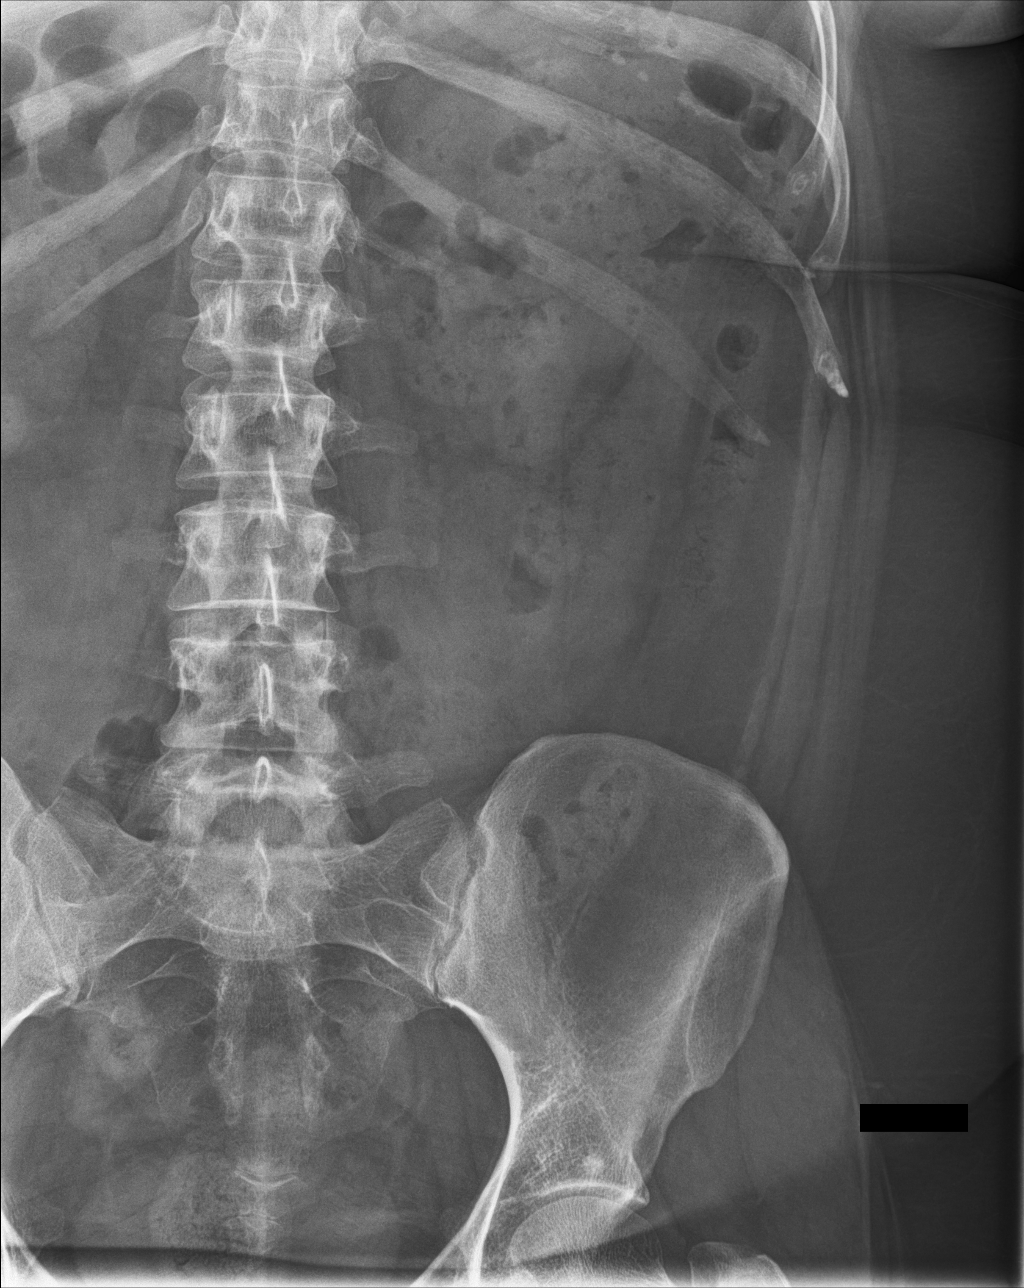

[abdomen kub (4 of 4)]
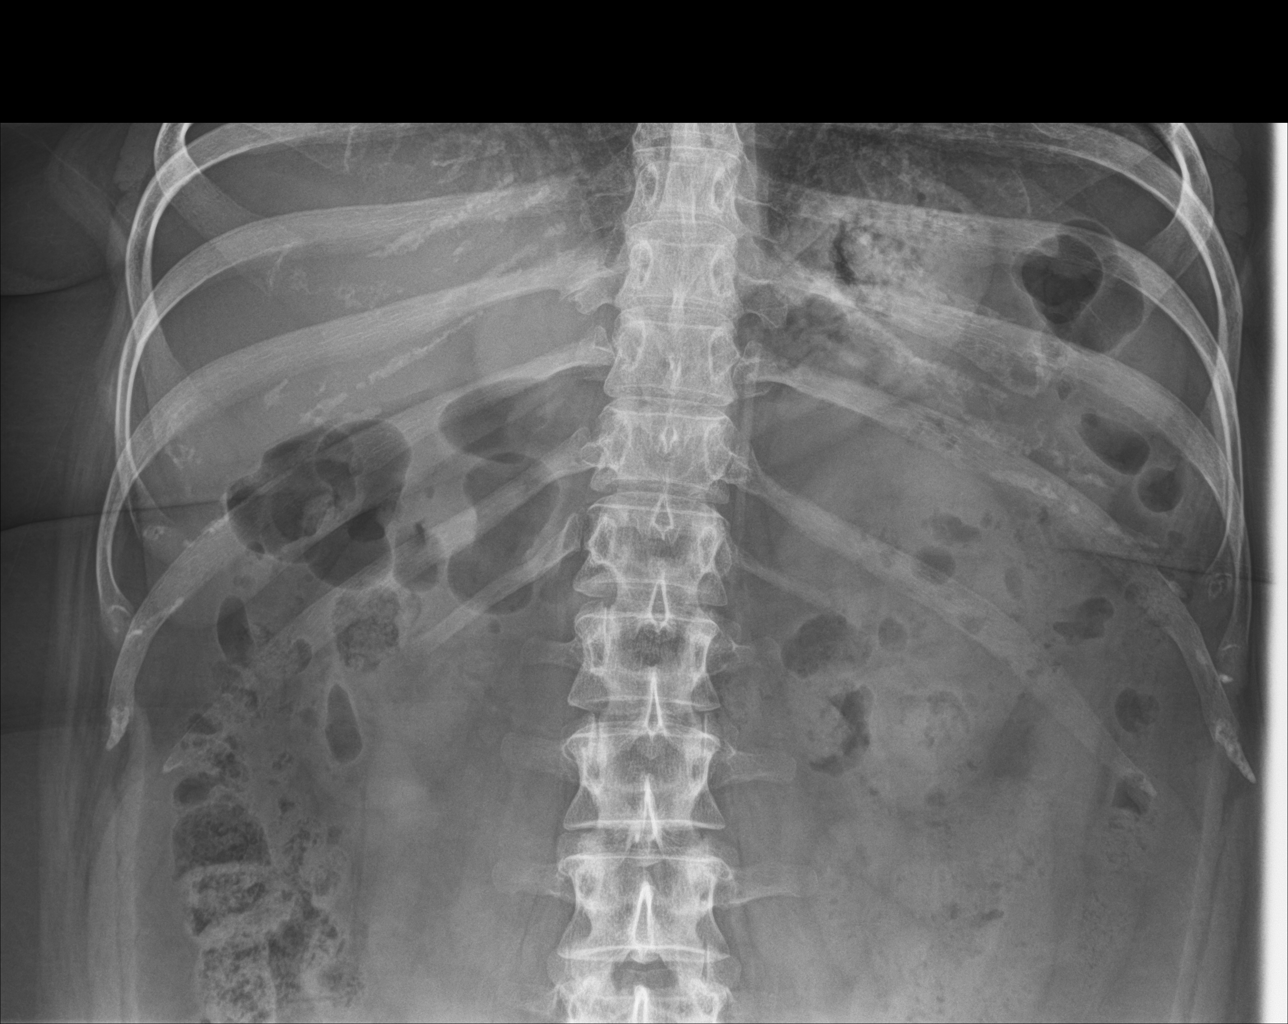

[4 of 4 positions shown; findings below may reference images not displayed]

FINDINGS: The bowel gas pattern is normal. A large amount of stool is seen
within the ascending colon. No radio-opaque calculi or other
significant radiographic abnormality are seen.
IMPRESSION: Large stool burden without evidence of bowel obstruction.

## 2023-02-01 ENCOUNTER — Encounter: Payer: Self-pay | Admitting: Family Medicine

## 2023-02-01 ENCOUNTER — Ambulatory Visit: Payer: BC Managed Care – PPO | Admitting: Family Medicine

## 2023-02-01 VITALS — BP 128/76 | HR 80 | Ht 64.0 in | Wt 249.0 lb

## 2023-02-01 DIAGNOSIS — F32A Depression, unspecified: Secondary | ICD-10-CM

## 2023-02-01 DIAGNOSIS — E282 Polycystic ovarian syndrome: Secondary | ICD-10-CM

## 2023-02-01 DIAGNOSIS — E7800 Pure hypercholesterolemia, unspecified: Secondary | ICD-10-CM

## 2023-02-01 DIAGNOSIS — E559 Vitamin D deficiency, unspecified: Secondary | ICD-10-CM

## 2023-02-01 DIAGNOSIS — G43709 Chronic migraine without aura, not intractable, without status migrainosus: Secondary | ICD-10-CM

## 2023-02-01 DIAGNOSIS — E785 Hyperlipidemia, unspecified: Secondary | ICD-10-CM

## 2023-02-01 DIAGNOSIS — F419 Anxiety disorder, unspecified: Secondary | ICD-10-CM

## 2023-02-01 MED ORDER — PROPRANOLOL HCL 20 MG PO TABS: 20.0000 mg | ORAL_TABLET | Freq: Two times a day (BID) | ORAL | 1 refills | Status: DC

## 2023-02-01 MED ORDER — ROSUVASTATIN CALCIUM 10 MG PO TABS: 10.0000 mg | ORAL_TABLET | Freq: Every day | ORAL | 1 refills | Status: DC

## 2023-02-01 MED ORDER — METFORMIN HCL ER 500 MG PO TB24: ORAL_TABLET | ORAL | 1 refills | Status: DC

## 2023-02-01 MED ORDER — QULIPTA 30 MG PO TABS
1.0000 | ORAL_TABLET | Freq: Every day | ORAL | 2 refills | Status: DC
Start: 2023-02-01 — End: 2023-08-17

## 2023-02-01 MED ORDER — AUVELITY 45-105 MG PO TBCR
EXTENDED_RELEASE_TABLET | ORAL | 2 refills | Status: DC
Start: 2023-02-01 — End: 2023-02-26

## 2023-02-05 ENCOUNTER — Telehealth: Payer: Self-pay

## 2023-02-05 NOTE — Telephone Encounter (Signed)
PA completed waiting on insurance approval.  Key: [id number]  KP

## 2023-02-05 NOTE — Telephone Encounter (Signed)
PA completed waiting on insurance approval.  Key: [id number]  KP

## 2023-02-08 ENCOUNTER — Other Ambulatory Visit: Payer: Self-pay

## 2023-02-09 NOTE — Telephone Encounter (Signed)
Approved  Authorization Expiration Date: 05/03/2023  KP

## 2023-02-11 ENCOUNTER — Encounter: Payer: Self-pay | Admitting: Family Medicine

## 2023-02-11 NOTE — Progress Notes (Signed)
     Primary Care / Sports Medicine Office Visit  Patient Information:  Patient ID: Vicki Yang, female DOB: 1990-12-06 Age: 33 y.o. MRN: [id number]   Vicki Yang is a pleasant 32 y.o. female presenting with the following:  Chief Complaint  Patient presents with   Anxiety    Vitals:   02/01/23 1350  BP: 128/76  Pulse: 80  SpO2: 99%   Vitals:   02/01/23 1350  Weight: 249 lb (112.9 kg)  Height: 5\' 4"  (1.626 m)   Body mass index is 42.74 kg/m.  No results found.   Independent interpretation of notes and tests performed by another provider:   None  Procedures performed:   None  Pertinent History, Exam, Impression, and Recommendations:   Vicki Yang was seen today for anxiety.  Hyperlipidemia, unspecified hyperlipidemia type  Vitamin D deficiency  Anxiety and depression Assessment & Plan: Has noted progressive worsening of symptoms despite prior control with bupropion regimen. We discussed treatment strategies, plan as follows:  - Initiate Auvelity after titration down of bupropion - Plan on follow-up in 5 months  Orders: -     Propranolol HCl; Take 1 tablet (20 mg total) by mouth 2 (two) times daily.  Dispense: 180 tablet; Refill: 1 -     Auvelity; Take 1 tab PO daily x 3 days then increase to 1 tab PO BID  Dispense: 180 tablet; Refill: 2  PCOS (polycystic ovarian syndrome) -     metFORMIN HCl ER; 1 tablet by mouth daily  Dispense: 90 tablet; Refill: 1  Pure hypercholesterolemia -     Rosuvastatin Calcium; Take 1 tablet (10 mg total) by mouth daily.  Dispense: 90 tablet; Refill: 1  Chronic migraine without aura without status migrainosus, not intractable Overview: Tried and Failed Sumatriptan, Rizatriptan, Zolmitriptan, and Eletriptan.   Orders: Bennie Pierini; Take 1 tablet (30 mg total) by mouth daily.  Dispense: 90 tablet; Refill: 2     Orders & Medications Meds ordered this encounter  Medications   metFORMIN (GLUCOPHAGE-XR) 500 MG 24 hr  tablet    Sig: 1 tablet by mouth daily    Dispense:  90 tablet    Refill:  1   propranolol (INDERAL) 20 MG tablet    Sig: Take 1 tablet (20 mg total) by mouth 2 (two) times daily.    Dispense:  180 tablet    Refill:  1   rosuvastatin (CRESTOR) 10 MG tablet    Sig: Take 1 tablet (10 mg total) by mouth daily.    Dispense:  90 tablet    Refill:  1   Atogepant (QULIPTA) 30 MG TABS    Sig: Take 1 tablet (30 mg total) by mouth daily.    Dispense:  90 tablet    Refill:  2   Dextromethorphan-buPROPion ER (AUVELITY) 45-105 MG TBCR    Sig: Take 1 tab PO daily x 3 days then increase to 1 tab PO BID    Dispense:  180 tablet    Refill:  2   No orders of the defined types were placed in this encounter.    No follow-ups on file.     Jerrol Banana, MD, [facility]   Primary Care Sports Medicine Primary Care and Sports Medicine at [facility]

## 2023-02-11 NOTE — Assessment & Plan Note (Signed)
Has noted progressive worsening of symptoms despite prior control with bupropion regimen. We discussed treatment strategies, plan as follows:  - Initiate Auvelity after titration down of bupropion - Plan on follow-up in 5 months

## 2023-02-11 NOTE — Telephone Encounter (Signed)
Please review.  KP

## 2023-02-11 NOTE — Assessment & Plan Note (Signed)
Will initiate adjunct Qulipta.

## 2023-02-22 NOTE — Telephone Encounter (Signed)
Please review.  KP

## 2023-02-23 ENCOUNTER — Other Ambulatory Visit: Payer: Self-pay | Admitting: Family Medicine

## 2023-02-23 MED ORDER — HYDROXYZINE PAMOATE 25 MG PO CAPS: 25.0000 mg | ORAL_CAPSULE | Freq: Four times a day (QID) | ORAL | 0 refills | Status: DC | PRN

## 2023-02-23 NOTE — Telephone Encounter (Signed)
Please review.  KP

## 2023-02-26 ENCOUNTER — Other Ambulatory Visit: Payer: Self-pay | Admitting: Family Medicine

## 2023-02-26 DIAGNOSIS — F419 Anxiety disorder, unspecified: Secondary | ICD-10-CM

## 2023-02-26 MED ORDER — BUPROPION HCL ER (SR) 100 MG PO TB12: 200.0000 mg | ORAL_TABLET | Freq: Every day | ORAL | 0 refills | Status: DC

## 2023-02-26 NOTE — Telephone Encounter (Signed)
Please review.  KP

## 2023-03-29 ENCOUNTER — Other Ambulatory Visit: Payer: Self-pay | Admitting: Family Medicine

## 2023-03-29 DIAGNOSIS — K5900 Constipation, unspecified: Secondary | ICD-10-CM

## 2023-03-29 MED ORDER — LACTULOSE 10 G PO PACK
PACK | ORAL | 2 refills | Status: AC
Start: 2023-03-29 — End: ?

## 2023-03-29 NOTE — Telephone Encounter (Signed)
fyi

## 2023-05-02 ENCOUNTER — Encounter: Payer: Self-pay | Admitting: Family Medicine

## 2023-05-03 NOTE — Telephone Encounter (Signed)
Please advise 

## 2023-05-06 ENCOUNTER — Other Ambulatory Visit: Payer: Self-pay

## 2023-05-06 DIAGNOSIS — K5900 Constipation, unspecified: Secondary | ICD-10-CM

## 2023-05-06 MED ORDER — LACTULOSE 10 G PO PACK
PACK | ORAL | 2 refills | Status: DC
Start: 2023-05-06 — End: 2023-07-14

## 2023-06-09 ENCOUNTER — Ambulatory Visit
Admission: EM | Admit: 2023-06-09 | Discharge: 2023-06-09 | Disposition: A | Discharge status: Home / Self Care | Payer: BC Managed Care – PPO | Attending: Family Medicine | Admitting: Family Medicine

## 2023-06-09 ENCOUNTER — Encounter: Payer: Self-pay | Admitting: Family Medicine

## 2023-06-09 ENCOUNTER — Encounter: Payer: Self-pay | Admitting: Emergency Medicine

## 2023-06-09 DIAGNOSIS — R519 Headache, unspecified: Secondary | ICD-10-CM

## 2023-06-09 DIAGNOSIS — R110 Nausea: Secondary | ICD-10-CM

## 2023-06-09 DIAGNOSIS — R42 Dizziness and giddiness: Secondary | ICD-10-CM

## 2023-06-09 MED ORDER — MECLIZINE HCL 25 MG PO TABS
25.0000 mg | ORAL_TABLET | Freq: Three times a day (TID) | ORAL | 0 refills | Status: DC | PRN
Start: 2023-06-09 — End: 2023-07-14

## 2023-06-09 MED ORDER — METHOCARBAMOL 500 MG PO TABS: 500.0000 mg | ORAL_TABLET | Freq: Two times a day (BID) | ORAL | 0 refills | Status: AC

## 2023-06-09 NOTE — ED Provider Notes (Signed)
[facility]    CSN: [id number] Arrival date & time: 06/09/23  0951      History   Chief Complaint Chief Complaint  Patient presents with   Dizziness   Nausea    HPI Vicki Yang is a 32 y.o. female.   HPI   Vicki Yang presents for dizziness upon waking up this morning. She sat up and nearly feel out of bed. Had similar sx that resoved with the Epley maneuver.  But she and her husband tried to do the Epley maneuver but it did not work.  She gets dizzy when she looks down.  Has some nausea when the room starts to spin.  Walking down the steps to get out of their home made the dizziness work.  She states she can't walk or stand. Feels like she is on a terrible carnival ride. She is unsure what it is.    Has been having neck and back stiffness and pain for the last week but carries stress in her shoulder. Has had a very stressful week.  She used Lidocaine patches which helped.    She had a migraine therefore she took Fioricet last night which helped her headache.  Notes at times when she has migraines she has aphasia.       Past Medical History:  Diagnosis Date   Allergy    Annual physical exam 06/18/2021   Anxiety    Broken wrist    bilateral   Depression    GERD (gastroesophageal reflux disease)    Migraine    PCOS (polycystic ovarian syndrome)     Patient Active Problem List   Diagnosis Date Noted   Scalp cyst 10/02/2021   Low serum vitamin D 10/02/2021   Hyperlipidemia 10/02/2021   Constipation 06/18/2021   Annual physical exam 06/18/2021   Migraine    PCOS (polycystic ovarian syndrome) 05/21/2021   Right upper quadrant abdominal pain 05/21/2021   Gastroesophageal reflux disease with esophagitis 05/21/2021   Obesity, Class III, BMI 40-49.9 (morbid obesity) (HCC) 05/21/2021   Anxiety and depression 05/21/2021    Past Surgical History:  Procedure Laterality Date   CHOLECYSTECTOMY  12/2019    OB History     Gravida  0   Para  0   Term   0   Preterm  0   AB  0   Living  0      SAB  0   IAB  0   Ectopic  0   Multiple  0   Live Births  0            Home Medications    Prior to Admission medications   Medication Sig Start Date End Date Taking? Authorizing Provider  Atogepant (QULIPTA) 30 MG TABS Take 1 tablet (30 mg total) by mouth daily. 02/01/23  Yes Jerrol Banana, MD  buPROPion ER [facility] SR) 100 MG 12 hr tablet Take 2 tablets (200 mg total) by mouth at bedtime. 02/26/23  Yes Jerrol Banana, MD  butalbital-acetaminophen-caffeine (FIORICET) [phone] MG tablet Take 1 tab at headache onset, can repeat once in 4 hours if needed.  No more than 2 doses in 24 hours 12/10/21  Yes [provider]  famotidine (PEPCID) 20 MG tablet Take 1 tablet (20 mg total) by mouth 2 (two) times daily. 05/21/21 06/09/23 Yes Jerrol Banana, MD  hydrOXYzine (VISTARIL) 25 MG capsule Take 1-2 capsules (25-50 mg total) by mouth every 6 (six) hours as needed for anxiety. 02/23/23  Yes Jerrol Banana, MD  lactulose (KRISTALOSE) 10 g packet 10 to 20 g (15 to 30 mL or 1 to 2 packets) daily; may increase to 40 g (60 mL or 2 to 4 packets) daily if necessary 05/06/23  Yes Jerrol Banana, MD  meclizine (ANTIVERT) 25 MG tablet Take 1 tablet (25 mg total) by mouth 3 (three) times daily as needed for dizziness. 06/09/23  Yes Daisuke Bailey, Seward Meth, DO  metFORMIN (GLUCOPHAGE-XR) 500 MG 24 hr tablet 1 tablet by mouth daily 02/01/23  Yes Jerrol Banana, MD  methocarbamol (ROBAXIN) 500 MG tablet Take 1 tablet (500 mg total) by mouth 2 (two) times daily. 06/09/23  Yes Deshanti Adcox, DO  Norethindrone-Ethinyl Estradiol-Fe Biphas (LO LOESTRIN FE) 1 MG-10 MCG / 10 MCG tablet Take 1 tablet by mouth daily. 10/12/22  Yes Dominic, Courtney Heys, CNM  NURTEC 75 MG TBDP TAKE 75 MG BY MOUTH EVERY OTHER DAY. 02/11/22  Yes Jerrol Banana, MD  propranolol (INDERAL) 20 MG tablet Take 1 tablet (20 mg total) by mouth 2 (two) times daily. 02/01/23  Yes  Jerrol Banana, MD  rosuvastatin (CRESTOR) 10 MG tablet Take 1 tablet (10 mg total) by mouth daily. 02/01/23  Yes Jerrol Banana, MD  fluticasone (FLONASE) 50 MCG/ACT nasal spray Place 1 spray into both nostrils daily.    [provider]  Fremanezumab-vfrm (AJOVY) 225 MG/1.5ML SOAJ Inject into the skin. 10/01/22   [provider]  LORazepam (ATIVAN) 2 MG tablet Take 1 tablet (2 mg total) by mouth as needed for anxiety. Take one tablet the night before scheduled exam and then one tablet the morning of your scheduled exam Patient not taking: Reported on 02/01/2023 10/13/22   Dominic, Courtney Heys, CNM    Family History Family History  Problem Relation Age of Onset   Depression Mother    Hypertension Mother    Ovarian cysts Mother    Migraines Mother    Heart disease Mother    Anxiety disorder Mother    Obesity Mother    Hypertension Father    Diabetes type I Father    Retinal degeneration Father    Diabetes Father    Obesity Father    Mental illness Brother    Anxiety disorder Brother    Asthma Brother    Diabetes Maternal Grandmother    Lymphoma Maternal Grandmother    Cancer Maternal Grandmother    Obesity Maternal Grandmother    Obesity Paternal Grandmother    Ovarian cancer Maternal Great-grandmother     Social History Social History   Tobacco Use   Smoking status: Never   Smokeless tobacco: Never  Vaping Use   Vaping status: Never Used  Substance Use Topics   Alcohol use: Not Currently    Comment: About 1 drink per month currently   Drug use: Never     Allergies   Cefoperazone and Sulfa antibiotics   Review of Systems Review of Systems: negative unless otherwise stated in HPI.      Physical Exam Triage Vital Signs ED Triage Vitals  Encounter Vitals Group     BP 06/09/23 1012 122/85     Systolic BP Percentile --      Diastolic BP Percentile --      Pulse Rate 06/09/23 1012 82     Resp 06/09/23 1012 18     Temp 06/09/23 1012 98.8 F  (37.1 C)     Temp Source 06/09/23 1012 Oral     SpO2 06/09/23 1012  96 %     Weight --      Height --      Head Circumference --      Peak Flow --      Pain Score 06/09/23 1009 2     Pain Loc --      Pain Education --      Exclude from Growth Chart --    Orthostatic VS for the past 24 hrs:  BP- Lying Pulse- Lying BP- Sitting Pulse- Sitting BP- Standing at 0 minutes Pulse- Standing at 0 minutes  06/09/23 1026 115/78 78 120/81 82 112/80 93    Updated Vital Signs BP 122/85 (BP Location: Right Arm)   Pulse 82   Temp 98.8 F (37.1 C) (Oral)   Resp 18   SpO2 96%   Visual Acuity Right Eye Distance:   Left Eye Distance:   Bilateral Distance:    Right Eye Near:   Left Eye Near:    Bilateral Near:     Physical Exam GEN:     alert, well appearing and no distress    HENT:  mucus membranes moist, oropharyngeal without lesions or erythema,  nares patent, no nasal discharge, uvula midline EYES:   pupils equal and reactive, EOM intact NECK:  supple, normal ROM RESP:  clear to auscultation bilaterally, no increased work of breathing  CVS:   regular rate and rhythm, no murmur, distal pulses intact   EXT:   normal ROM, atraumatic, no edema  NEURO:  alert, oriented, speech normal, CN 2-12 grossly intact, no facial droop,  sensation grossly intact, strength 5/5 bilateral UE and LE, normal coordination, normal finger to nose, Positive Romberg  Skin:   warm and dry      UC Treatments / Results  Labs (all labs ordered are listed, but only abnormal results are displayed) Labs Reviewed - No data to display  EKG  If EKG performed, see my interpretation in the MDM section  Radiology No results found.   Procedures Procedures (including critical care time)  Medications Ordered in UC Medications - No data to display  Initial Impression / Assessment and Plan / UC Course  I have reviewed the triage vital signs and the nursing notes.  Pertinent labs & imaging results that were  available during my care of the patient were reviewed by me and considered in my medical decision making (see chart for details).     Pt is a 32 y.o. female with history of migraines and vertigo  who presents for room spinning dizziness for the past 3 days.  She does have a mild headache. There has been no syncope.  I doubt acute cerebellar CVA. History not consistent with complex migraine. She does not have upper respiratory symptoms to suggest labyrinthitis. No hearing loss to suggest Mnire's disease. No foreign bodies seen.  No acute otitis media or externa.  TSH in January 2024 was normal.  On exam, she has signs of vertigo.  Positive Romberg but Epley not performed as she attempts to get very nauseous with this.  EKG showing normal sinus rhythm with some sinus arrhythmia but no acute ST or T wave changes.  No prior available for comparison.  Orthostatics are unremarkable. On chart review, she has no head imaging available which is surprising giving her history of migraines with aphasia.  Discussed that if aphasia returns, she has chest pain, shortness of breath, increasing headache she should be seen in the emergency department as she likely needs head imaging.  I highly suspect this is BPPV.  Treat with meclizine.  Patient knows how  toperform Epley maneuver at home.  Handout also given.  She has been under a great deal of stress that may have a tension headache as well.  Headache during our exam was 2 out of 10.  She has some cervical and thoracic hypertonicity.  Recommended muscle relaxer and she is agreeable.  Prescribed Robaxin twice daily PRN.   ED precautions reviewed and patient voiced understanding. Pt may need vestibular rehab, if symptoms not improving.    Final Clinical Impressions(s) / UC Diagnoses   Final diagnoses:  Vertigo  Nonintractable headache, unspecified chronicity pattern, unspecified headache type  Nausea without vomiting     Discharge Instructions      Go to  ED for red flag symptoms, including; fever, severe headaches, vision changes, numbness/weakness in part of the body, lethargy, confusion, intractable vomiting, severe dehydration, chest pain, breathing difficulty, severe persistent abdominal or pelvic pain, signs of severe infection (increased redness, swelling of an area), feeling faint or passing out, dizziness, etc. You should especially go to the ED for sudden acute worsening of condition if you do not elect to go at this time.   Stop by the pharmacy to pick up your prescriptions.  Follow up with your primary care provider as to discuss head imaging and vestibular rehab.        ED Prescriptions     Medication Sig Dispense Auth. Provider   methocarbamol (ROBAXIN) 500 MG tablet Take 1 tablet (500 mg total) by mouth 2 (two) times daily. 20 tablet Quiara Killian, DO   meclizine (ANTIVERT) 25 MG tablet Take 1 tablet (25 mg total) by mouth 3 (three) times daily as needed for dizziness. 30 tablet Katha Cabal, DO      PDMP not reviewed this encounter.   Katha Cabal, DO 06/09/23 1612

## 2023-06-09 NOTE — Discharge Instructions (Addendum)
Go to ED for red flag symptoms, including; fever, severe headaches, vision changes, numbness/weakness in part of the body, lethargy, confusion, intractable vomiting, severe dehydration, chest pain, breathing difficulty, severe persistent abdominal or pelvic pain, signs of severe infection (increased redness, swelling of an area), feeling faint or passing out, dizziness, etc. You should especially go to the ED for sudden acute worsening of condition if you do not elect to go at this time.   Stop by the pharmacy to pick up your prescriptions.  Follow up with your primary care provider as to discuss head imaging and vestibular rehab.

## 2023-06-09 NOTE — ED Triage Notes (Signed)
Pt presents with nausea, headache and dizziness that started this morning. Pt feels off balance and room is spinning.

## 2023-06-15 ENCOUNTER — Other Ambulatory Visit: Payer: Self-pay | Admitting: Family Medicine

## 2023-06-15 DIAGNOSIS — G43709 Chronic migraine without aura, not intractable, without status migrainosus: Secondary | ICD-10-CM

## 2023-06-15 DIAGNOSIS — R42 Dizziness and giddiness: Secondary | ICD-10-CM

## 2023-06-15 NOTE — Telephone Encounter (Signed)
Please review.  KP

## 2023-06-16 NOTE — Telephone Encounter (Signed)
Please review.  KP

## 2023-06-17 ENCOUNTER — Encounter: Payer: Self-pay | Admitting: Neurology

## 2023-06-17 ENCOUNTER — Encounter: Payer: Self-pay | Admitting: Gastroenterology

## 2023-06-17 ENCOUNTER — Ambulatory Visit: Payer: BC Managed Care – PPO | Admitting: Gastroenterology

## 2023-06-17 VITALS — BP 117/79 | HR 97 | Temp 98.4°F | Ht 63.0 in | Wt 245.0 lb

## 2023-06-17 DIAGNOSIS — K5900 Constipation, unspecified: Secondary | ICD-10-CM

## 2023-06-17 MED ORDER — TRULANCE 3 MG PO TABS: 1.0000 | ORAL_TABLET | Freq: Every day | ORAL | Status: DC

## 2023-06-17 NOTE — Progress Notes (Signed)
Gastroenterology Consultation  Referring Provider:     Jerrol Banana, MD Primary Care Physician:  Jerrol Banana, MD Primary Gastroenterologist:  Dr. Servando Snare     Reason for Consultation:     Constipation        HPI:   Vicki Yang is a 32 y.o. y/o female referred for consultation & management of constipation by Dr. Ashley Royalty, Ocie Bob, MD. This patient comes in today with a history of migraine headaches and a recent visit to the emergency department for vertigo.  The patient had notified her doctor that she was having constipation and she related to her migraine medication Qulipta.  She had stated that when she missed the dose of this medication she would not have the same symptoms.  The medication had been reported to help her migraine significantly and she did not want to stop the medication.  She was put on [place] and referred to GI. The patient says that when she gets constipated the medications not working she has a lot of pain that takes her over an hour to move her bowels but she feels like she is pushing and nothing will come out.  There is no report of any unexplained weight loss fevers chills nausea vomiting black stools or bloody stools.  The patient states that prior to starting this medication for her migraine she was constipated and then had her gallbladder out which resulted in her having diarrhea and then it went back to the constipation after she started this medication.  She reports taking lactulose and MiraLAX resulted in her having trouble when her schedule changes.  Past Medical History:  Diagnosis Date   Allergy    Annual physical exam 06/18/2021   Anxiety    Broken wrist    bilateral   Depression    GERD (gastroesophageal reflux disease)    Migraine    PCOS (polycystic ovarian syndrome)     Past Surgical History:  Procedure Laterality Date   CHOLECYSTECTOMY  12/2019    Prior to Admission medications   Medication Sig Start Date End Date Taking?  Authorizing Provider  Atogepant (QULIPTA) 30 MG TABS Take 1 tablet (30 mg total) by mouth daily. 02/01/23   Jerrol Banana, MD  buPROPion ER [facility] SR) 100 MG 12 hr tablet Take 2 tablets (200 mg total) by mouth at bedtime. 02/26/23   Jerrol Banana, MD  butalbital-acetaminophen-caffeine (FIORICET) [phone] MG tablet Take 1 tab at headache onset, can repeat once in 4 hours if needed.  No more than 2 doses in 24 hours 12/10/21   [provider]  famotidine (PEPCID) 20 MG tablet Take 1 tablet (20 mg total) by mouth 2 (two) times daily. 05/21/21 06/09/23  Jerrol Banana, MD  fluticasone (FLONASE) 50 MCG/ACT nasal spray Place 1 spray into both nostrils daily.    [provider]  hydrOXYzine (VISTARIL) 25 MG capsule Take 1-2 capsules (25-50 mg total) by mouth every 6 (six) hours as needed for anxiety. 02/23/23   Jerrol Banana, MD  lactulose (KRISTALOSE) 10 g packet 10 to 20 g (15 to 30 mL or 1 to 2 packets) daily; may increase to 40 g (60 mL or 2 to 4 packets) daily if necessary 05/06/23   Jerrol Banana, MD  meclizine (ANTIVERT) 25 MG tablet Take 1 tablet (25 mg total) by mouth 3 (three) times daily as needed for dizziness. 06/09/23   Brimage, Seward Meth, DO  metFORMIN (GLUCOPHAGE-XR) 500 MG 24 hr tablet 1  tablet by mouth daily 02/01/23   Jerrol Banana, MD  methocarbamol (ROBAXIN) 500 MG tablet Take 1 tablet (500 mg total) by mouth 2 (two) times daily. 06/09/23   Katha Cabal, DO  Norethindrone-Ethinyl Estradiol-Fe Biphas (LO LOESTRIN FE) 1 MG-10 MCG / 10 MCG tablet Take 1 tablet by mouth daily. 10/12/22   Dominic, Courtney Heys, CNM  NURTEC 75 MG TBDP TAKE 75 MG BY MOUTH EVERY OTHER DAY. 02/11/22   Jerrol Banana, MD  propranolol (INDERAL) 20 MG tablet Take 1 tablet (20 mg total) by mouth 2 (two) times daily. 02/01/23   Jerrol Banana, MD  rosuvastatin (CRESTOR) 10 MG tablet Take 1 tablet (10 mg total) by mouth daily. 02/01/23   Jerrol Banana, MD    Family History   Problem Relation Age of Onset   Depression Mother    Hypertension Mother    Ovarian cysts Mother    Migraines Mother    Heart disease Mother    Anxiety disorder Mother    Obesity Mother    Hypertension Father    Diabetes type I Father    Retinal degeneration Father    Diabetes Father    Obesity Father    Mental illness Brother    Anxiety disorder Brother    Asthma Brother    Diabetes Maternal Grandmother    Lymphoma Maternal Grandmother    Cancer Maternal Grandmother    Obesity Maternal Grandmother    Obesity Paternal Grandmother    Ovarian cancer Maternal Great-grandmother      Social History   Tobacco Use   Smoking status: Never   Smokeless tobacco: Never  Vaping Use   Vaping status: Never Used  Substance Use Topics   Alcohol use: Not Currently    Comment: About 1 drink per month currently   Drug use: Never    Allergies as of 06/17/2023 - Review Complete 06/09/2023  Allergen Reaction Noted   Cefoperazone Rash 05/21/2021   Sulfa antibiotics Rash 05/21/2021    Review of Systems:    All systems reviewed and negative except where noted in HPI.   Physical Exam:  There were no vitals taken for this visit. No LMP recorded. (Menstrual status: Oral contraceptives). General:   Alert,  Well-developed, well-nourished, pleasant and cooperative in NAD Head:  Normocephalic and atraumatic. Eyes:  Sclera clear, no icterus.   Conjunctiva pink. Ears:  Normal auditory acuity. Rectal:  Deferred.  Pulses:  Normal pulses noted. Extremities:  No clubbing or edema.  No cyanosis. Neurologic:  Alert and oriented x3;  grossly normal neurologically. Psych:  Alert and cooperative. Normal mood and affect.  Imaging Studies: No results found.  Assessment and Plan:   [name] is a 32 y.o. y/o female comes today with constipation.  The patient has been doing well on MiraLAX and lactulose but states that it is due to scheduling and if she messes with time and she starts having  constipation which is very painful and she spent an hour trying to move her bowels.  The patient will be given samples of Trulance to see if this helps her symptoms and if it does she will call us for prescription. The the patient has been explained the plan and agrees with it.    Midge Minium, MD. Clementeen Graham    Note: This dictation was prepared with Dragon dictation along with smaller phrase technology. Any transcriptional errors that result from this process are unintentional.

## 2023-06-17 NOTE — Patient Instructions (Signed)
 Trulance samples given ?

## 2023-06-18 ENCOUNTER — Ambulatory Visit: Payer: BC Managed Care – PPO

## 2023-06-25 ENCOUNTER — Encounter: Payer: Self-pay | Admitting: Gastroenterology

## 2023-06-28 MED ORDER — TRULANCE 3 MG PO TABS: 1.0000 | ORAL_TABLET | Freq: Every day | ORAL | 2 refills | Status: DC

## 2023-07-14 ENCOUNTER — Encounter: Payer: Self-pay | Admitting: Family Medicine

## 2023-07-14 ENCOUNTER — Ambulatory Visit (INDEPENDENT_AMBULATORY_CARE_PROVIDER_SITE_OTHER): Payer: BC Managed Care – PPO | Admitting: Family Medicine

## 2023-07-14 VITALS — BP 114/80 | HR 102 | Ht 63.0 in | Wt 242.2 lb

## 2023-07-14 DIAGNOSIS — Z0000 Encounter for general adult medical examination without abnormal findings: Secondary | ICD-10-CM | POA: Diagnosis not present

## 2023-07-14 DIAGNOSIS — R42 Dizziness and giddiness: Secondary | ICD-10-CM | POA: Diagnosis not present

## 2023-07-14 DIAGNOSIS — K5904 Chronic idiopathic constipation: Secondary | ICD-10-CM

## 2023-07-14 DIAGNOSIS — E559 Vitamin D deficiency, unspecified: Secondary | ICD-10-CM

## 2023-07-14 DIAGNOSIS — E785 Hyperlipidemia, unspecified: Secondary | ICD-10-CM

## 2023-07-14 DIAGNOSIS — F419 Anxiety disorder, unspecified: Secondary | ICD-10-CM

## 2023-07-14 DIAGNOSIS — Z131 Encounter for screening for diabetes mellitus: Secondary | ICD-10-CM

## 2023-07-14 DIAGNOSIS — G43101 Migraine with aura, not intractable, with status migrainosus: Secondary | ICD-10-CM

## 2023-07-14 DIAGNOSIS — K2100 Gastro-esophageal reflux disease with esophagitis, without bleeding: Secondary | ICD-10-CM

## 2023-07-14 DIAGNOSIS — E282 Polycystic ovarian syndrome: Secondary | ICD-10-CM

## 2023-07-14 DIAGNOSIS — F32A Depression, unspecified: Secondary | ICD-10-CM

## 2023-07-14 NOTE — Assessment & Plan Note (Addendum)
Severe recet episode of vertigo, which they describe as having improved since their last visit.  Vertigo Recent episode of vertigo, possibly related to stress, otoconia, migraine history. -Neurology appointment scheduled for January 14th for establishment of care and further evaluation.

## 2023-07-14 NOTE — Assessment & Plan Note (Signed)
Annual examination completed, risk stratification labs ordered, anticipatory guidance provided.  We will follow labs once resulted. 

## 2023-07-14 NOTE — Assessment & Plan Note (Addendum)
The patient also reports a significant improvement in their migraines since starting Qulipta, with a reduction from multiple migraines per week to only five to eight migraines since July.  Migraine Infrequent migraines managed very well with Qulipta, Nurtec, and Fioricet as needed. -Continue current migraine management plan.

## 2023-07-14 NOTE — Assessment & Plan Note (Signed)
Gastroesophageal Reflux Disease (GERD) Managed with famotidine, currently taking at night.  Will take twice daily if needed. -Continue famotidine as needed.

## 2023-07-14 NOTE — Progress Notes (Signed)
Annual Physical Exam Visit  Patient Information:  Patient ID: Vicki Yang, female DOB: 12-16-1990 Age: 32 y.o. MRN: [id number]   Subjective:   CC: Annual Physical Exam  HPI:  Vicki Yang is here for their annual physical.  I reviewed the past medical history, family history, social history, surgical history, and allergies today and changes were made as necessary.  Please see the problem list section below for additional details.  Past Medical History: Past Medical History:  Diagnosis Date   Allergy    Annual physical exam 06/18/2021   Anxiety    Broken wrist    bilateral   Depression    GERD (gastroesophageal reflux disease)    Migraine    PCOS (polycystic ovarian syndrome)    Past Surgical History: Past Surgical History:  Procedure Laterality Date   CHOLECYSTECTOMY  12/2019   Family History: Family History  Problem Relation Age of Onset   Depression Mother    Hypertension Mother    Ovarian cysts Mother    Migraines Mother    Heart disease Mother    Anxiety disorder Mother    Obesity Mother    Hypertension Father    Diabetes type I Father    Retinal degeneration Father    Diabetes Father    Obesity Father    Mental illness Brother    Anxiety disorder Brother    Asthma Brother    Diabetes Maternal Grandmother    Lymphoma Maternal Grandmother    Cancer Maternal Grandmother    Obesity Maternal Grandmother    Obesity Paternal Grandmother    Ovarian cancer Maternal Great-grandmother    Allergies: Allergies  Allergen Reactions   Cefoperazone Rash   Sulfa Antibiotics Rash   Health Maintenance: Health Maintenance  Topic Date Due   Cervical Cancer Screening (HPV/Pap Cotest)  Never done   COVID-19 Vaccine (1 - 2023-24 season) 07/30/2023 (Originally 04/04/2023)   INFLUENZA VACCINE  11/01/2023 (Originally 03/04/2023)   Hepatitis C Screening  Completed   HIV Screening  Completed   HPV VACCINES  Aged Out   DTaP/Tdap/Td  Discontinued    HM  Colonoscopy     This patient has no relevant Health Maintenance data.      Medications: Current Outpatient Medications on File Prior to Visit  Medication Sig Dispense Refill   Atogepant (QULIPTA) 30 MG TABS Take 1 tablet (30 mg total) by mouth daily. 90 tablet 2   buPROPion ER (WELLBUTRIN SR) 100 MG 12 hr tablet Take 2 tablets (200 mg total) by mouth at bedtime. 30 tablet 0   butalbital-acetaminophen-caffeine (FIORICET) 50-325-40 MG tablet Take 1 tab at headache onset, can repeat once in 4 hours if needed.  No more than 2 doses in 24 hours     fluticasone (FLONASE) 50 MCG/ACT nasal spray Place 1 spray into both nostrils daily.     hydrOXYzine (VISTARIL) 25 MG capsule Take 1-2 capsules (25-50 mg total) by mouth every 6 (six) hours as needed for anxiety. 30 capsule 0   metFORMIN (GLUCOPHAGE-XR) 500 MG 24 hr tablet 1 tablet by mouth daily 90 tablet 1   methocarbamol (ROBAXIN) 500 MG tablet Take 1 tablet (500 mg total) by mouth 2 (two) times daily. 20 tablet 0   Norethindrone-Ethinyl Estradiol-Fe Biphas (LO LOESTRIN FE) 1 MG-10 MCG / 10 MCG tablet Take 1 tablet by mouth daily. 84 tablet 3   NURTEC 75 MG TBDP TAKE 75 MG BY MOUTH EVERY OTHER DAY. 16 tablet 5   Plecanatide (TRULANCE) 3 MG  TABS Take 1 tablet (3 mg total) by mouth daily. 90 tablet 2   propranolol (INDERAL) 20 MG tablet Take 1 tablet (20 mg total) by mouth 2 (two) times daily. 180 tablet 1   rosuvastatin (CRESTOR) 10 MG tablet Take 1 tablet (10 mg total) by mouth daily. 90 tablet 1   famotidine (PEPCID) 20 MG tablet Take 1 tablet (20 mg total) by mouth 2 (two) times daily. 60 tablet 2   No current facility-administered medications on file prior to visit.    Objective:   Vitals:   07/14/23 0800  BP: 114/80  Pulse: (!) 102  SpO2: 94%   Vitals:   07/14/23 0800  Weight: 242 lb 3.2 oz (109.9 kg)  Height: 5\' 3"  (1.6 m)   Body mass index is 42.9 kg/m.  General: Well Developed, well nourished, and in no acute distress.   Neuro: Alert and oriented x3, extra-ocular muscles intact, sensation grossly intact. Cranial nerves II through XII are grossly intact, motor, sensory, and coordinative functions are intact. HEENT: Normocephalic, atraumatic, neck supple, no masses, no lymphadenopathy, thyroid nonenlarged. Oropharynx, nasopharynx, external ear canals are unremarkable. Skin: Warm and dry, no rashes noted.  Cardiac: Regular rate and rhythm, no murmurs rubs or gallops. Pulses symmetric. Respiratory: Clear to auscultation bilaterally. Speaking in full sentences.  Abdominal: Soft, nontender, nondistended, positive bowel sounds, no masses, no organomegaly. Musculoskeletal: Stable, and with full range of motion.  Impression and Recommendations:   The patient was counselled, risk factors were discussed, and anticipatory guidance given.  Problem List Items Addressed This Visit       Cardiovascular and Mediastinum   Migraine    The patient also reports a significant improvement in their migraines since starting Qulipta, with a reduction from multiple migraines per week to only five to eight migraines since July.  Migraine Infrequent migraines managed very well with Qulipta, Nurtec, and Fioricet as needed. -Continue current migraine management plan.        Digestive   Gastroesophageal reflux disease with esophagitis    Gastroesophageal Reflux Disease (GERD) Managed with famotidine, currently taking at night.  Will take twice daily if needed. -Continue famotidine as needed.        Endocrine   PCOS (polycystic ovarian syndrome)    Has established with gynecology.  Is due for cervical cancer screening, they report that they have been prescribed an anxiety medication to take prior to gynecological exam, plans on pursuing this.        Other   Anxiety and depression    Anxiety Elevated GAD score.  Hallucinations noted with Auvelity trial.  Currently on Wellbutrin, propranolol, and hydroxyzine as  needed. -Consider adding Buspirone for anxiety management.      Chronic vertigo    Severe recet episode of vertigo, which they describe as having improved since their last visit.  Vertigo Recent episode of vertigo, possibly related to stress, otoconia, migraine history. -Neurology appointment scheduled for January 14th for establishment of care and further evaluation.      Constipation    Has started Trulance through gastroenterology, still in the process of adjusting her dose.      Healthcare maintenance - Primary    Annual examination completed, risk stratification labs ordered, anticipatory guidance provided.  We will follow labs once resulted.      Relevant Orders   CBC   Comprehensive metabolic panel   Hemoglobin A1c   Lipid panel   TSH   VITAMIN D 25 Hydroxy (Vit-D Deficiency, Fractures)  Hyperlipidemia    Hyperlipidemia Elevated cholesterol levels noted in previous labs. -Order fasting labs to monitor cholesterol levels.      Relevant Orders   Comprehensive metabolic panel   Lipid panel   Vitamin D deficiency    -Continue vitamin D supplementation and recheck levels with labs.      Relevant Orders   VITAMIN D 25 Hydroxy (Vit-D Deficiency, Fractures)   Other Visit Diagnoses     Screening for diabetes mellitus       Relevant Orders   Hemoglobin A1c        Orders & Medications Medications: No orders of the defined types were placed in this encounter.  Orders Placed This Encounter  Procedures   CBC   Comprehensive metabolic panel   Hemoglobin A1c   Lipid panel   TSH   VITAMIN D 25 Hydroxy (Vit-D Deficiency, Fractures)     Return in about 1 year (around 07/13/2024) for CPE.    Jerrol Banana, MD, [facility]   Primary Care Sports Medicine Primary Care and Sports Medicine at [facility]

## 2023-07-14 NOTE — Assessment & Plan Note (Signed)
Has established with gynecology.  Is due for cervical cancer screening, they report that they have been prescribed an anxiety medication to take prior to gynecological exam, plans on pursuing this.

## 2023-07-14 NOTE — Assessment & Plan Note (Signed)
Hyperlipidemia Elevated cholesterol levels noted in previous labs. -Order fasting labs to monitor cholesterol levels.

## 2023-07-14 NOTE — Assessment & Plan Note (Signed)
Anxiety Elevated GAD score.  Hallucinations noted with Auvelity trial.  Currently on Wellbutrin, propranolol, and hydroxyzine as needed. -Consider adding Buspirone for anxiety management.

## 2023-07-14 NOTE — Assessment & Plan Note (Signed)
Has started Trulance through gastroenterology, still in the process of adjusting her dose.

## 2023-07-14 NOTE — Patient Instructions (Addendum)
-   Obtain fasting labs with orders provided (can have water or black coffee but otherwise no food or drink x 8 hours before labs) - Review information provided - Attend eye doctor annually, dentist every 6 months, work towards or maintain 30 minutes of moderate intensity physical activity at least 5 days per week, and consume a balanced diet - Return in 1 year for physical - Contact us for any questions between now and then  Additionally: -We discussed possibly adding Buspirone to your current medications, if interested in pursuing this, contact our office for next steps -Can research compounded oral semaglutide for weight management

## 2023-07-14 NOTE — Assessment & Plan Note (Signed)
-  Continue vitamin D supplementation and recheck levels with labs.

## 2023-07-19 ENCOUNTER — Telehealth: Payer: Self-pay

## 2023-07-19 NOTE — Telephone Encounter (Signed)
PA completed waiting on insurance approval.  Key: [id number]  [id number]

## 2023-07-21 NOTE — Telephone Encounter (Signed)
Approved   07/19/23-07/18/2024  KP

## 2023-08-09 ENCOUNTER — Encounter: Payer: Self-pay | Admitting: Gastroenterology

## 2023-08-13 ENCOUNTER — Other Ambulatory Visit: Payer: Self-pay | Admitting: Family Medicine

## 2023-08-13 DIAGNOSIS — F419 Anxiety disorder, unspecified: Secondary | ICD-10-CM

## 2023-08-16 ENCOUNTER — Other Ambulatory Visit: Payer: Self-pay | Admitting: Family Medicine

## 2023-08-16 ENCOUNTER — Encounter: Payer: Self-pay | Admitting: Family Medicine

## 2023-08-16 DIAGNOSIS — G43709 Chronic migraine without aura, not intractable, without status migrainosus: Secondary | ICD-10-CM

## 2023-08-16 NOTE — Progress Notes (Signed)
 NEUROLOGY CONSULTATION NOTE  Vicki Yang MRN: [id number] DOB: 03-25-1991  Referring provider: Selinda Ku, MD Primary care provider: Selinda Ku, MD  Reason for consult:  migraines  Assessment/Plan:   Migraine with aura, without status migrainosus, not intractable Ocular migraine Transient vertigo - I suspect this was another episode of BPPV.  It has resolved and she hasn't had a recurrence.   Migraine prevention:  To further optimize migraine control and reduce severity of migraine attacks, increase Qulipta  to 60mg  daily Migraine rescue:  Nurtec as needed.  Alternatively, will have her try samples of Zavzpret to see if aborts the migraine faster and treats aura as well.   Zofran -ODT 4mg  for nausea.  Advised to discontinue Fioricet. Limit use of pain relievers to no more than 2 days out of week to prevent risk of rebound or medication-overuse headache. Keep headache diary Given no change in her migraines and normal neurologic exam, I don't feel brain imaging is warranted. Follow up 6 months.  Total time spent in chart and face to face with patient:  62 minutes   Subjective:  Vicki Yang is a 33 year old female with PCOS, migraines, depression and anxiety who presents for migraines.  History supplemented by her accompanying husband and referring provider's note.  Migraines started around 33 years old.  They are 6/10 (occasionally 8-9/10).  Throbbing/pounding (stabbing with change in position).  Usually left frontal/occipital, occasionally on right side.  Aggravated by movement.  Associated with expressive aphasia/word-finding difficulty, nausea, osmophobia; rarely vomiting, photophobia and phonophobia.  Afterward, will not remember anything that occurred during a migraine.  Usually occur in evening and resolved when wakes up in morning.  Usually lasts several hours.  They were occurring 3 to 4 times a week, worse during school year.  Started Qulipta  in July 2024.   Since then, occurring around less than 5 a month (usually 2-3).  She may have a couple of mild headaches (2/10) and without aphasia).  Triggers stress, seasonal allergies, and alcohol, caffeine withdrawal.  Showers, heating pads, rest and occasionally ice help relieve them.  Nurtec and Fioricet will abort the headache but does not abort the associated symptoms such as aphasia.  Will only abort completely if able to take it within 15 minutes of onset.    On two occasions she had an ocular migraine in which she had visual aura described as a sunspot or a crack in her vision, lasting 30 minutes.  No associated headache.  Even though she has migraine with aura, she states that she needs to take birth control to treat her PCOS.  She is on a low dose of estrogen.    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  She had an episode on 06/09/2023 but did not respond to therapy.  Woke up with vertigo described as severe spinning.  Could not sit up, stand or walk.  Associated with nausea.  Also with neck pain.  No associated headache.  Seen in the ED.  Treated with meclizine  and discharged on methocarbamol  for neck pain.  First day was severe but continued to have less severe dizziness for another 4 days.  She did subsequently seem to respond to the Epley maneuver.  Treated with dramamine.  May have been stress-induced.  She had her second ocular migraine the following week.    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  Since her migraines are accompanied by aphasia, it may be difficult to teach during class if  she has a migraine.    Past NSAIDS/analgesics:  ibuprofen, naproxen, Excedrin, acetaminophen Past abortive triptans:  sumatriptan  tab, rizatriptan, zolmitriptan - all exacerbated nausea and vomiting Past abortive ergotamine:  none Past muscle relaxants:  none Past anti-emetic:  Zofran , promethazine Past antihypertensive medications:  none Past antidepressant medications:   nortriptyline, venlafaxine, sertraline, duloxetine Past anticonvulsant medications:  topiramate, zonisamide Past anti-CGRP:  Ajovy  (passed out from needles), Nurtec every other day (wears off) Past vitamins/Herbal/Supplements:  none Past antihistamines/decongestants:  Dramamine, Benadryl, meclizine  Other past therapies:  none  Rescue protocol:  Nurtec, methocarbamol  (neck tension) Current NSAIDS/analgesics:  Fioricet (with and without caffeine) Current triptans:  none Current ergotamine:  none Current anti-emetic:  none Current muscle relaxants:  methocarbamol  500mg  BID Current Antihypertensive medications:  propranolol  20mg  BID (anxiety, ineffective for migraine) Current Antidepressant medications:  bupropion  ER 200mg  QHS Current Anticonvulsant medications:  none Current anti-CGRP:  Qulipta  30mg  daily, Nurtec PRN Current Vitamins/Herbal/Supplements:  none Current Antihistamines/Decongestants:  none Other therapy:  neck massage Birth control:  Lo Loestrin  (PCOS) Other medications:  hydroxyzine    Caffeine:  1 cup of coffee daily.  Occasional soda Alcohol:  occasionally (less than 2 drinks a month) Smoker:  no Diet:  normally drinks 30-40 oz water.  Often skips lunch.   Exercise:  not routine Depression:  yes; Anxiety:  yes.  Controlled. Sleep hygiene:  good.  Falls asleep within 10 minutes.  Usually sleeps through the night (7-8 hours of sleep a night).  Feels rested.  Family history of headache:  mom (migraines), maternal great-grandmother, brother, cousin, maternal aunt.        PAST MEDICAL HISTORY: Past Medical History:  Diagnosis Date   Allergy    Annual physical exam 06/18/2021   Anxiety    Broken wrist    bilateral   Depression    GERD (gastroesophageal reflux disease)    Migraine    PCOS (polycystic ovarian syndrome)     PAST SURGICAL HISTORY: Past Surgical History:  Procedure Laterality Date   CHOLECYSTECTOMY  12/2019    MEDICATIONS: Current Outpatient  Medications on File Prior to Visit  Medication Sig Dispense Refill   Atogepant  (QULIPTA ) 30 MG TABS Take 1 tablet (30 mg total) by mouth daily. 90 tablet 2   buPROPion  ER (WELLBUTRIN  SR) 100 MG 12 hr tablet Take 2 tablets (200 mg total) by mouth at bedtime. 30 tablet 0   butalbital-acetaminophen-caffeine (FIORICET) 50-325-40 MG tablet Take 1 tab at headache onset, can repeat once in 4 hours if needed.  No more than 2 doses in 24 hours     famotidine  (PEPCID ) 20 MG tablet Take 1 tablet (20 mg total) by mouth 2 (two) times daily. 60 tablet 2   fluticasone (FLONASE) 50 MCG/ACT nasal spray Place 1 spray into both nostrils daily.     hydrOXYzine  (VISTARIL ) 25 MG capsule Take 1-2 capsules (25-50 mg total) by mouth every 6 (six) hours as needed for anxiety. 30 capsule 0   metFORMIN  (GLUCOPHAGE -XR) 500 MG 24 hr tablet 1 tablet by mouth daily 90 tablet 1   methocarbamol  (ROBAXIN ) 500 MG tablet Take 1 tablet (500 mg total) by mouth 2 (two) times daily. 20 tablet 0   Norethindrone-Ethinyl Estradiol-Fe Biphas (LO LOESTRIN FE ) 1 MG-10 MCG / 10 MCG tablet Take 1 tablet by mouth daily. 84 tablet 3   NURTEC 75 MG TBDP TAKE 75 MG BY MOUTH EVERY OTHER DAY. 16 tablet 5   Plecanatide  (TRULANCE ) 3 MG TABS Take 1 tablet (3 mg total) by  mouth daily. 90 tablet 2   propranolol  (INDERAL ) 20 MG tablet Take 1 tablet (20 mg total) by mouth 2 (two) times daily. 180 tablet 1   rosuvastatin  (CRESTOR ) 10 MG tablet Take 1 tablet (10 mg total) by mouth daily. 90 tablet 1   No current facility-administered medications on file prior to visit.    ALLERGIES: Allergies  Allergen Reactions   Cefoperazone Rash   Sulfa Antibiotics Rash    FAMILY HISTORY: Family History  Problem Relation Age of Onset   Depression Mother    Hypertension Mother    Ovarian cysts Mother    Migraines Mother    Heart disease Mother    Anxiety disorder Mother    Obesity Mother    Hypertension Father    Diabetes type I Father    Retinal  degeneration Father    Diabetes Father    Obesity Father    Mental illness Brother    Anxiety disorder Brother    Asthma Brother    Diabetes Maternal Grandmother    Lymphoma Maternal Grandmother    Cancer Maternal Grandmother    Obesity Maternal Grandmother    Obesity Paternal Grandmother    Ovarian cancer Maternal Great-grandmother     Objective:  Blood pressure 108/65, pulse 84, height 5' 3 (1.6 m), weight 241 lb (109.3 kg), SpO2 97%. General: No acute distress.  Patient appears well-groomed.   Head:  Normocephalic/atraumatic Eyes:  fundi examined but not visualized Neck: supple, no paraspinal tenderness, full range of motion Heart: regular rate and rhythm Neurological Exam: Mental status: alert and oriented to person, place, and time, speech fluent and not dysarthric, language intact. Cranial nerves: CN I: not tested CN II: pupils equal, round and reactive to light, visual fields intact CN III, IV, VI:  full range of motion, no nystagmus, no ptosis CN V: facial sensation intact. CN VII: upper and lower face symmetric CN VIII: hearing intact CN IX, X: gag intact, uvula midline CN XI: sternocleidomastoid and trapezius muscles intact CN XII: tongue midline Bulk & Tone: normal, no fasciculations. Motor:  muscle strength 5/5 throughout Sensation:  Pinprick and vibratory sensation intact. Deep Tendon Reflexes:  2+ throughout,  toes downgoing.   Finger to nose testing:  Without dysmetria.   Gait:  Normal station and stride.  Romberg negative.    Thank you for allowing me to take part in the care of this patient.  Juliene Dunnings, DO  CC: Selinda Ku, MD

## 2023-08-16 NOTE — Telephone Encounter (Signed)
 Requested medications are due for refill today.  yes  Requested medications are on the active medications list.  yes  Last refill. 02/26/2023 #30 0 rf  Future visit scheduled.   yes  Notes to clinic.  Labs are expired.    Requested Prescriptions  Pending Prescriptions Disp Refills   buPROPion  ER (WELLBUTRIN  SR) 100 MG 12 hr tablet [Pharmacy Med Name: Bupropion  Hydrochloride 100mg  Extended-Release (SR) Tablet] 180 tablet     Sig: Take 2 tablets by mouth at bedtime.     Psychiatry: Antidepressants - bupropion  Failed - 08/16/2023  4:35 PM      Failed - Cr in normal range and within 360 days    Creatinine, Ser  Date Value Ref Range Status  08/05/2022 0.80 0.57 - 1.00 mg/dL Final         Failed - AST in normal range and within 360 days    AST  Date Value Ref Range Status  08/05/2022 24 0 - 40 IU/L Final         Failed - ALT in normal range and within 360 days    ALT  Date Value Ref Range Status  08/05/2022 31 0 - 32 IU/L Final         Passed - Completed PHQ-2 or PHQ-9 in the last 360 days      Passed - Last BP in normal range    BP Readings from Last 1 Encounters:  07/14/23 114/80         Passed - Valid encounter within last 6 months    Recent Outpatient Visits           1 month ago Healthcare maintenance   [facility] Primary Care & [facility] Medicine at [facility], Selinda PARAS, MD   6 months ago Hyperlipidemia, unspecified hyperlipidemia type   [facility] Primary Care & Sports Medicine at [facility], Selinda PARAS, MD   1 year ago Annual physical exam   [facility] Primary Care & Sports Medicine at [facility], Selinda PARAS, MD   1 year ago Scalp cyst   [facility] Primary Care & Sports Medicine at [facility], Selinda PARAS, MD   2 years ago Annual physical exam   [facility] Primary Care & Sports Medicine at [facility], Selinda PARAS, MD       Future Appointments             Tomorrow Skeet Juliene SAUNDERS, DO [place]   Neurology   In 11 months Ku, Selinda PARAS, MD [facility] Primary Care & Sports Medicine at [facility], [facility]

## 2023-08-16 NOTE — Telephone Encounter (Signed)
 Please send in Bupropion in for patient. Discussed at [facility]OV. Thanks JM

## 2023-08-17 ENCOUNTER — Encounter: Payer: Self-pay | Admitting: Neurology

## 2023-08-17 ENCOUNTER — Ambulatory Visit: Payer: BC Managed Care – PPO | Admitting: Neurology

## 2023-08-17 ENCOUNTER — Other Ambulatory Visit: Payer: Self-pay | Admitting: Neurology

## 2023-08-17 VITALS — BP 108/65 | HR 84 | Ht 63.0 in | Wt 241.0 lb

## 2023-08-17 DIAGNOSIS — G43709 Chronic migraine without aura, not intractable, without status migrainosus: Secondary | ICD-10-CM | POA: Diagnosis not present

## 2023-08-17 DIAGNOSIS — G43109 Migraine with aura, not intractable, without status migrainosus: Secondary | ICD-10-CM | POA: Diagnosis not present

## 2023-08-17 MED ORDER — NURTEC 75 MG PO TBDP: 75.0000 mg | ORAL_TABLET | Freq: Every day | ORAL | 5 refills | Status: DC | PRN

## 2023-08-17 MED ORDER — ONDANSETRON 4 MG PO TBDP: 4.0000 mg | ORAL_TABLET | Freq: Three times a day (TID) | ORAL | 5 refills | Status: DC | PRN

## 2023-08-17 MED ORDER — QULIPTA 60 MG PO TABS: 60.0000 mg | ORAL_TABLET | Freq: Every day | ORAL | 1 refills | Status: DC

## 2023-08-17 NOTE — Patient Instructions (Addendum)
  Increase Qulipta  to 60mg  daily.   Take Nurtec at earliest onset of headache.  Maximum 1 tablet in 24 hours.  Alternatively, try Zavzpret nasal spray one nostril once daily as needed.  Zofran -ODT 4mg  for nausea Limit use of pain relievers to no more than 2 days out of the week.  These medications include acetaminophen, NSAIDs (ibuprofen/Advil/Motrin, naproxen/Aleve, triptans (Imitrex /sumatriptan ), Excedrin, and narcotics.  This will help reduce risk of rebound headaches. Be aware of common food triggers: Routine exercise Stay adequately hydrated (aim for 64 oz water daily) Keep headache diary Maintain proper stress management Maintain proper sleep hygiene Do not skip meals Consider supplements:  magnesium citrate 400mg  daily, riboflavin 400mg  daily, coenzyme Q10 300mg   daily.

## 2023-08-17 NOTE — Telephone Encounter (Signed)
 Requested medication (s) are due for refill today -no  Requested medication (s) are on the active medication list -yes  Future visit scheduled -yes  Last refill: 02/01/23 #90 2RF  Notes to clinic: off protocol- provider review - too soon for RF  Requested Prescriptions  Pending Prescriptions Disp Refills   QULIPTA  30 MG TABS [Pharmacy Med Name: QULIPTA  30mg  Tablet] 90 tablet 1    Sig: Take 1 tablet by mouth daily.     Off-Protocol Failed - 08/17/2023 10:12 AM      Failed - Medication not assigned to a protocol, review manually.      Passed - Valid encounter within last 12 months    Recent Outpatient Visits           1 month ago Healthcare maintenance   [facility], Selinda PARAS, MD   6 months ago Hyperlipidemia, unspecified hyperlipidemia type   [facility] at [facility], Selinda PARAS, MD   1 year ago Annual physical exam   [facility] at [facility], Selinda PARAS, MD   1 year ago Scalp cyst   [place] [facility], Selinda PARAS, MD   2 years ago Annual physical exam   [facility] at [facility], Selinda PARAS, MD       Future Appointments             Today Skeet Juliene SAUNDERS, DO [facility]   In 11 months Ku Selinda PARAS, MD [facility] Primary Care & Sports Medicine at [facility], [facility]               Requested Prescriptions  Pending Prescriptions Disp Refills   QULIPTA  30 MG TABS [Pharmacy Med Name: QULIPTA  30mg  Tablet] 90 tablet 1    Sig: Take 1 tablet by mouth daily.     Off-Protocol Failed - 08/17/2023 10:12 AM      Failed - Medication not assigned to a protocol, review manually.      Passed - Valid encounter within last 12 months    Recent Outpatient Visits           1 month ago Healthcare maintenance   [facility] at [facility], Selinda PARAS, MD   6 months ago Hyperlipidemia, unspecified hyperlipidemia type   [facility] at [facility], Selinda PARAS, MD   1 year ago Annual physical exam   [facility] at [facility], Selinda PARAS, MD   1 year ago Scalp cyst   [facility] at [facility], Selinda PARAS, MD   2 years ago Annual physical exam   [facility] & [facility] at [facility], Selinda PARAS, MD       Future Appointments             Today Skeet Juliene SAUNDERS, DO [facility]   In 11 months Ku, Selinda PARAS, MD [facility] Primary Care & Sports Medicine at [facility], [facility]

## 2023-08-25 ENCOUNTER — Other Ambulatory Visit: Payer: Self-pay | Admitting: Gastroenterology

## 2023-08-25 MED ORDER — LINACLOTIDE 145 MCG PO CAPS: 145.0000 ug | ORAL_CAPSULE | Freq: Every day | ORAL | 5 refills | Status: DC

## 2023-08-25 NOTE — Addendum Note (Signed)
Addended by: Roena Malady on: 08/25/2023 02:26 PM   Modules accepted: Orders

## 2023-08-26 ENCOUNTER — Telehealth: Payer: Self-pay

## 2023-08-26 NOTE — Telephone Encounter (Signed)
 Submitted PA through cover my meds for the Linzess 145. Waiting on response from insurance company

## 2023-08-26 NOTE — Telephone Encounter (Signed)
Request Reference Number: [id number]. LINZESS CAP is approved through 08/25/2024. Your patient may now fill this prescription and it will be covered.

## 2023-09-22 ENCOUNTER — Other Ambulatory Visit: Payer: Self-pay | Admitting: Licensed Practical Nurse

## 2023-09-22 DIAGNOSIS — Z3041 Encounter for surveillance of contraceptive pills: Secondary | ICD-10-CM

## 2023-09-24 ENCOUNTER — Other Ambulatory Visit: Payer: Self-pay

## 2023-09-24 ENCOUNTER — Encounter: Payer: Self-pay | Admitting: Licensed Practical Nurse

## 2023-09-24 DIAGNOSIS — Z3041 Encounter for surveillance of contraceptive pills: Secondary | ICD-10-CM

## 2023-09-24 MED ORDER — NORETHIN-ETH ESTRAD-FE BIPHAS 1 MG-10 MCG / 10 MCG PO TABS: 1.0000 | ORAL_TABLET | Freq: Every day | ORAL | 0 refills | Status: DC

## 2023-09-25 ENCOUNTER — Other Ambulatory Visit: Payer: Self-pay | Admitting: Family Medicine

## 2023-09-25 DIAGNOSIS — G43709 Chronic migraine without aura, not intractable, without status migrainosus: Secondary | ICD-10-CM

## 2023-09-27 NOTE — Telephone Encounter (Signed)
 Change in dosage on 08/17/23, will refuse this request.  Requested Prescriptions  Pending Prescriptions Disp Refills   QULIPTA 30 MG TABS [Pharmacy Med Name: QULIPTA 30mg  Tablet] 90 tablet 1    Sig: Take 1 tablet by mouth daily.     Off-Protocol Failed - 09/27/2023  5:27 PM      Failed - Medication not assigned to a protocol, review manually.      Passed - Valid encounter within last 12 months    Recent Outpatient Visits           2 months ago Healthcare maintenance   [facility] at [facility], Ocie Bob, MD   7 months ago Hyperlipidemia, unspecified hyperlipidemia type   [facility] at [facility], Ocie Bob, MD   1 year ago Annual physical exam   [facility] at [facility], Ocie Bob, MD   1 year ago Scalp cyst   [facility] at [facility], Ocie Bob, MD   2 years ago Annual physical exam   [facility] Primary Care & Sports Medicine at [facility], Ocie Bob, MD       Future Appointments             In 9 months Ashley Royalty, Ocie Bob, MD [facility] Primary Care & Sports Medicine at [facility], [facility]

## 2023-10-27 ENCOUNTER — Other Ambulatory Visit: Payer: Self-pay | Admitting: Family Medicine

## 2023-10-27 DIAGNOSIS — E7800 Pure hypercholesterolemia, unspecified: Secondary | ICD-10-CM

## 2023-10-27 DIAGNOSIS — F419 Anxiety disorder, unspecified: Secondary | ICD-10-CM

## 2023-10-27 DIAGNOSIS — F32A Anxiety disorder, unspecified: Secondary | ICD-10-CM

## 2023-10-29 ENCOUNTER — Encounter: Payer: Self-pay | Admitting: Licensed Practical Nurse

## 2023-10-29 ENCOUNTER — Other Ambulatory Visit (HOSPITAL_COMMUNITY)
Admission: RE | Admit: 2023-10-29 | Discharge: 2023-10-29 | Disposition: A | Discharge status: Home / Self Care | Source: Ambulatory Visit | Attending: Licensed Practical Nurse | Admitting: Licensed Practical Nurse

## 2023-10-29 ENCOUNTER — Ambulatory Visit: Payer: BC Managed Care – PPO | Admitting: Licensed Practical Nurse

## 2023-10-29 VITALS — BP 113/77 | HR 83 | Ht 63.0 in | Wt 234.4 lb

## 2023-10-29 DIAGNOSIS — Z124 Encounter for screening for malignant neoplasm of cervix: Secondary | ICD-10-CM

## 2023-10-29 DIAGNOSIS — Z3041 Encounter for surveillance of contraceptive pills: Secondary | ICD-10-CM

## 2023-10-29 DIAGNOSIS — Z01419 Encounter for gynecological examination (general) (routine) without abnormal findings: Secondary | ICD-10-CM | POA: Insufficient documentation

## 2023-10-29 MED ORDER — NORETHIN-ETH ESTRAD-FE BIPHAS 1 MG-10 MCG / 10 MCG PO TABS: 1.0000 | ORAL_TABLET | Freq: Every day | ORAL | 4 refills | Status: AC

## 2023-10-29 NOTE — Telephone Encounter (Signed)
 Requested Prescriptions  Pending Prescriptions Disp Refills   rosuvastatin (CRESTOR) 10 MG tablet [Pharmacy Med Name: Rosuvastatin Calcium 10mg  Tablet] 90 tablet 0    Sig: Take 1 tablet by mouth daily.     Cardiovascular:  Antilipid - Statins 2 Failed - 10/29/2023  9:05 AM      Failed - Cr in normal range and within 360 days    Creatinine, Ser  Date Value Ref Range Status  08/05/2022 0.80 0.57 - 1.00 mg/dL Final         Failed - Valid encounter within last 12 months    Recent Outpatient Visits   None     Future Appointments             In 8 months Jerrol Banana, MD [facility] Primary Care & Sports Medicine at [facility], [facility]            Failed - Lipid Panel in normal range within the last 12 months    Cholesterol, Total  Date Value Ref Range Status  08/05/2022 188 100 - 199 mg/dL Final   LDL Chol Calc (NIH)  Date Value Ref Range Status  08/05/2022 110 (H) 0 - 99 mg/dL Final   HDL  Date Value Ref Range Status  08/05/2022 52 >39 mg/dL Final   Triglycerides  Date Value Ref Range Status  08/05/2022 148 0 - 149 mg/dL Final         Passed - Patient is not pregnant       propranolol (INDERAL) 20 MG tablet [Pharmacy Med Name: Propranolol Hydrochloride 20mg  Tablet] 180 tablet 0    Sig: Take 1 tablet by mouth twice daily.     Cardiovascular:  Beta Blockers Failed - 10/29/2023  9:05 AM      Failed - Valid encounter within last 6 months    Recent Outpatient Visits   None     Future Appointments             In 8 months Ashley Royalty, Ocie Bob, MD [facility] Primary Care & Sports Medicine at [facility], [facility]            Passed - Last BP in normal range    BP Readings from Last 1 Encounters:  08/17/23 108/65         Passed - Last Heart Rate in normal range    Pulse Readings from Last 1 Encounters:  08/17/23 84
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
## 2023-10-29 NOTE — Telephone Encounter (Signed)
 Requested medications are due for refill today.  yes  Requested medications are on the active medications list.  yes  Last refill. 02/01/2023 #90 1 rf  Future visit scheduled.   yes  Notes to clinic.  Expired labs.    Requested Prescriptions  Pending Prescriptions Disp Refills   rosuvastatin (CRESTOR) 10 MG tablet [Pharmacy Med Name: Rosuvastatin Calcium 10mg  Tablet] 90 tablet 0    Sig: Take 1 tablet by mouth daily.     Cardiovascular:  Antilipid - Statins 2 Failed - 10/29/2023  9:06 AM      Failed - Cr in normal range and within 360 days    Creatinine, Ser  Date Value Ref Range Status  08/05/2022 0.80 0.57 - 1.00 mg/dL Final         Failed - Valid encounter within last 12 months    Recent Outpatient Visits   None     Future Appointments             In 8 months Jerrol Banana, MD [facility] Primary Care & Sports Medicine at [facility], [facility]            Failed - Lipid Panel in normal range within the last 12 months    Cholesterol, Total  Date Value Ref Range Status  08/05/2022 188 100 - 199 mg/dL Final   LDL Chol Calc (NIH)  Date Value Ref Range Status  08/05/2022 110 (H) 0 - 99 mg/dL Final   HDL  Date Value Ref Range Status  08/05/2022 52 >39 mg/dL Final   Triglycerides  Date Value Ref Range Status  08/05/2022 148 0 - 149 mg/dL Final         Passed - Patient is not pregnant      Signed Prescriptions Disp Refills   propranolol (INDERAL) 20 MG tablet 180 tablet 0    Sig: Take 1 tablet by mouth twice daily.     Cardiovascular:  Beta Blockers Failed - 10/29/2023  9:06 AM      Failed - Valid encounter within last 6 months    Recent Outpatient Visits   None     Future Appointments             In 8 months Ashley Royalty, Ocie Bob, MD [facility] Health Primary Care & Sports Medicine at [facility], [facility]            Passed - Last BP in normal range    BP Readings from Last 1 Encounters:  08/17/23 108/65         Passed - Last Heart Rate in normal  range    Pulse Readings from Last 1 Encounters:  08/17/23 84

## 2023-11-02 LAB — CYTOLOGY - PAP
Comment: NEGATIVE
Diagnosis: NEGATIVE
High risk HPV: NEGATIVE

## 2023-12-11 ENCOUNTER — Telehealth

## 2023-12-11 DIAGNOSIS — R051 Acute cough: Secondary | ICD-10-CM | POA: Diagnosis not present

## 2023-12-12 ENCOUNTER — Encounter: Payer: Self-pay | Admitting: Physician Assistant

## 2023-12-12 MED ORDER — BENZONATATE 100 MG PO CAPS: ORAL_CAPSULE | ORAL | 0 refills | Status: AC

## 2023-12-12 NOTE — Progress Notes (Signed)
 E-Visit for Cough   We are sorry that you are not feeling well.  Here is how we plan to help!  Based on your presentation I believe you most likely have A cough due to allergies.  I recommend that you start the an over-the counter-allergy medication such as Claritin 10 mg or Zyrtec 10 mg daily.     In addition you may use A prescription cough medication called Tessalon Perles 100mg . You may take 1-2 capsules every 8 hours as needed for your cough.    From your responses in the eVisit questionnaire you describe inflammation in the upper respiratory tract which is causing a significant cough.  This is commonly called Bronchitis and has four common causes:   Allergies Viral Infections Acid Reflux Bacterial Infection Allergies, viruses and acid reflux are treated by controlling symptoms or eliminating the cause. An example might be a cough caused by taking certain blood pressure medications. You stop the cough by changing the medication. Another example might be a cough caused by acid reflux. Controlling the reflux helps control the cough.  USE OF BRONCHODILATOR ("RESCUE") INHALERS: There is a risk from using your bronchodilator too frequently.  The risk is that over-reliance on a medication which only relaxes the muscles surrounding the breathing tubes can reduce the effectiveness of medications prescribed to reduce swelling and congestion of the tubes themselves.  Although you feel brief relief from the bronchodilator inhaler, your asthma may actually be worsening with the tubes becoming more swollen and filled with mucus.  This can delay other crucial treatments, such as oral steroid medications. If you need to use a bronchodilator inhaler daily, several times per day, you should discuss this with your provider.  There are probably better treatments that could be used to keep your asthma under control.     HOME CARE Only take medications as instructed by your medical team. Complete the entire  course of an antibiotic. Drink plenty of fluids and get plenty of rest. Avoid close contacts especially the very young and the elderly Cover your mouth if you cough or cough into your sleeve. Always remember to wash your hands A steam or ultrasonic humidifier can help congestion.   GET HELP RIGHT AWAY IF: You develop worsening fever. You become short of breath You cough up blood. Your symptoms persist after you have completed your treatment plan MAKE SURE YOU  Understand these instructions. Will watch your condition. Will get help right away if you are not doing well or get worse.    Thank you for choosing an e-visit.  Your e-visit answers were reviewed by a board certified advanced clinical practitioner to complete your personal care plan. Depending upon the condition, your plan could have included both over the counter or prescription medications.  Please review your pharmacy choice. Make sure the pharmacy is open so you can pick up prescription now. If there is a problem, you may contact your provider through [employer] and have the prescription routed to another pharmacy.  Your safety is important to us . If you have drug allergies check your prescription carefully.   For the next 24 hours you can use MyChart to ask questions about today's visit, request a non-urgent call back, or ask for a work or school excuse. You will get an email in the next two days asking about your experience. I hope that your e-visit has been valuable and will speed your recovery.  I have spent 5 minutes in review of e-visit questionnaire, review  and updating patient chart, medical decision making and response to patient.   Etter Hermann Mayers, PA-C

## 2024-01-12 ENCOUNTER — Other Ambulatory Visit: Payer: Self-pay | Admitting: Family Medicine

## 2024-01-12 DIAGNOSIS — E282 Polycystic ovarian syndrome: Secondary | ICD-10-CM

## 2024-01-14 NOTE — Telephone Encounter (Signed)
 Requested medication (s) are due for refill today: yes   Requested medication (s) are on the active medication list: yes   Last refill:  02/01/23#90 1 refills  Future visit scheduled: yes in 6 months   Notes to clinic:  last labs 08/05/22 last OV 07/14/23. Do you want to allow refills until next OV in 6 months?     Requested Prescriptions  Pending Prescriptions Disp Refills   metFORMIN  (GLUCOPHAGE -XR) 500 MG 24 hr tablet [Pharmacy Med Name: Metformin  Hydrochloride 500mg  Extended-Release Tablet] 90 tablet 0    Sig: Take 1 tablet by mouth daily.     Endocrinology:  Diabetes - Biguanides Failed - 01/14/2024 11:51 AM      Failed - Cr in normal range and within 360 days    Creatinine, Ser  Date Value Ref Range Status  08/05/2022 0.80 0.57 - 1.00 mg/dL Final         Failed - HBA1C is between 0 and 7.9 and within 180 days    No results found for: HGBA1C, LABA1C       Failed - eGFR in normal range and within 360 days    eGFR  Date Value Ref Range Status  08/05/2022 102 >59 mL/min/1.73 Final         Failed - B12 Level in normal range and within 720 days    No results found for: VITAMINB12       Failed - Valid encounter within last 6 months    Recent Outpatient Visits   None     Future Appointments             In 6 months Ma Saupe, MD [facility] Health Primary Care & Sports Medicine at [facility], [facility]            Failed - CBC within normal limits and completed in the last 12 months    WBC  Date Value Ref Range Status  08/05/2022 11.8 (H) 3.4 - 10.8 x10E3/uL Final   RBC  Date Value Ref Range Status  08/05/2022 4.86 3.77 - 5.28 x10E6/uL Final   Hemoglobin  Date Value Ref Range Status  08/05/2022 14.4 11.1 - 15.9 g/dL Final   Hematocrit  Date Value Ref Range Status  08/05/2022 43.8 34.0 - 46.6 % Final   MCHC  Date Value Ref Range Status  08/05/2022 32.9 31.5 - 35.7 g/dL Final   [facility]  Date Value Ref Range Status  08/05/2022 29.6 26.6 - 33.0 pg Final    MCV  Date Value Ref Range Status  08/05/2022 90 79 - 97 fL Final   No results found for: PLTCOUNTKUC, LABPLAT, POCPLA RDW  Date Value Ref Range Status  08/05/2022 12.2 11.7 - 15.4 % Final

## 2024-01-17 ENCOUNTER — Other Ambulatory Visit: Payer: Self-pay | Admitting: Neurology

## 2024-01-21 ENCOUNTER — Encounter: Payer: Self-pay | Admitting: Family Medicine

## 2024-01-21 NOTE — Telephone Encounter (Signed)
 Please review and advise.   JM

## 2024-01-25 ENCOUNTER — Other Ambulatory Visit: Payer: Self-pay | Admitting: Family Medicine

## 2024-01-25 DIAGNOSIS — F32A Depression, unspecified: Secondary | ICD-10-CM

## 2024-01-25 DIAGNOSIS — E7800 Pure hypercholesterolemia, unspecified: Secondary | ICD-10-CM

## 2024-01-27 NOTE — Telephone Encounter (Signed)
 Requested Prescriptions  Pending Prescriptions Disp Refills   propranolol  (INDERAL ) 20 MG tablet [Pharmacy Med Name: Propranolol  Hydrochloride 20mg  Tablet] 180 tablet 1    Sig: Take 1 tablet by mouth twice daily.     Cardiovascular:  Beta Blockers Failed - 01/27/2024 10:33 AM      Failed - Valid encounter within last 6 months    Recent Outpatient Visits   None     Future Appointments             In 5 months Alvia, Selinda PARAS, MD [facility] at [facility]            Passed - Last BP in normal range    BP Readings from Last 1 Encounters:  10/29/23 113/77         Passed - Last Heart Rate in normal range    Pulse Readings from Last 1 Encounters:  10/29/23 83          rosuvastatin  (CRESTOR ) 10 MG tablet [Pharmacy Med Name: Rosuvastatin  Calcium  10mg  Tablet] 90 tablet 1    Sig: Take 1 tablet by mouth daily.     Cardiovascular:  Antilipid - Statins 2 Failed - 01/27/2024 10:33 AM      Failed - Cr in normal range and within 360 days    Creatinine, Ser  Date Value Ref Range Status  08/05/2022 0.80 0.57 - 1.00 mg/dL Final         Failed - Valid encounter within last 12 months    Recent Outpatient Visits   None     Future Appointments             In 5 months Alvia Selinda PARAS, MD [facility] at [facility]            Failed - Lipid Panel in normal range within the last 12 months    Cholesterol, Total  Date Value Ref Range Status  08/05/2022 188 100 - 199 mg/dL Final   LDL Chol Calc (NIH)  Date Value Ref Range Status  08/05/2022 110 (H) 0 - 99 mg/dL Final   HDL  Date Value Ref Range Status  08/05/2022 52 >39 mg/dL Final   Triglycerides  Date Value Ref Range Status  08/05/2022 148 0 - 149 mg/dL Final         Passed - Patient is not pregnant

## 2024-01-31 ENCOUNTER — Encounter: Payer: Self-pay | Admitting: Neurology

## 2024-01-31 ENCOUNTER — Other Ambulatory Visit: Payer: Self-pay | Admitting: Neurology

## 2024-01-31 DIAGNOSIS — G43709 Chronic migraine without aura, not intractable, without status migrainosus: Secondary | ICD-10-CM

## 2024-01-31 MED ORDER — NURTEC 75 MG PO TBDP: 75.0000 mg | ORAL_TABLET | Freq: Every day | ORAL | 5 refills | Status: DC | PRN

## 2024-01-31 MED ORDER — ONDANSETRON 4 MG PO TBDP: 4.0000 mg | ORAL_TABLET | Freq: Three times a day (TID) | ORAL | 5 refills | Status: DC | PRN

## 2024-01-31 MED ORDER — QULIPTA 60 MG PO TABS: 1.0000 | ORAL_TABLET | Freq: Every day | ORAL | 1 refills | Status: DC

## 2024-02-08 ENCOUNTER — Telehealth: Payer: Self-pay | Admitting: Pharmacy Technician

## 2024-02-08 ENCOUNTER — Other Ambulatory Visit (HOSPITAL_COMMUNITY): Payer: Self-pay

## 2024-02-08 NOTE — Telephone Encounter (Signed)
 Pharmacy Patient Advocate Encounter   Received notification from Fax that prior authorization for NURTEC 75MG  is required/requested.   Insurance verification completed.   The patient is insured through [facility] [employer] .   Per test claim: PA required; PA submitted to above mentioned insurance via CoverMyMeds Key/confirmation #/EOC [facility] Status is pending

## 2024-02-15 ENCOUNTER — Ambulatory Visit: Payer: BC Managed Care – PPO | Admitting: Neurology

## 2024-02-20 ENCOUNTER — Encounter: Payer: Self-pay | Admitting: Licensed Practical Nurse

## 2024-02-23 NOTE — Telephone Encounter (Signed)
 Pharmacy Patient Advocate Encounter  Received notification from [facility] that Prior Authorization for Nurtec 75MG  dispersible tablets has been APPROVED from 02-08-2024 to 05-10-2024   PA #/Case ID/Reference #: [id number]

## 2024-04-13 ENCOUNTER — Telehealth: Payer: Self-pay | Admitting: Pharmacy Technician

## 2024-04-13 NOTE — Telephone Encounter (Signed)
 Pharmacy Patient Advocate Encounter   Received notification from Fax that prior authorization for NURTEC 75MG  is required/requested.   Insurance verification completed.   The patient is insured through [facility] .   Per test claim: PA required; PA submitted to above mentioned insurance via Latent Key/confirmation #/EOC [facility] Status is pending

## 2024-04-28 ENCOUNTER — Encounter: Payer: Self-pay | Admitting: Family Medicine

## 2024-04-28 NOTE — Telephone Encounter (Signed)
 Pharmacy Patient Advocate Encounter  Received notification from [facility] that Prior Authorization for Nurtec 75MG  dispersible tablets has been APPROVED from 04-13-2024 to 04-13-2026   PA #/Case ID/Reference #: [facility]

## 2024-05-01 ENCOUNTER — Other Ambulatory Visit: Payer: Self-pay | Admitting: Family Medicine

## 2024-05-01 DIAGNOSIS — F419 Anxiety disorder, unspecified: Secondary | ICD-10-CM

## 2024-05-01 MED ORDER — BUPROPION HCL ER (SR) 100 MG PO TB12: 200.0000 mg | ORAL_TABLET | Freq: Every day | ORAL | 3 refills | Status: DC

## 2024-05-01 NOTE — Telephone Encounter (Signed)
 Please review above conversation and advise. Thank you.   JM

## 2024-06-28 ENCOUNTER — Encounter: Payer: Self-pay | Admitting: Neurology

## 2024-07-10 NOTE — Progress Notes (Deleted)
 NEUROLOGY FOLLOW UP OFFICE NOTE  Vicki Yang [id number]  Assessment/Plan:   Migraine with aura, without status migrainosus, not intractable Ocular migraine Tension-type headache   Migraine prevention:  To further optimize migraine control and reduce severity of migraine attacks, increase Qulipta  to 60mg  daily Migraine rescue:  Nurtec as needed.  Alternatively, will have her try samples of Zavzpret to see if aborts the migraine faster and treats aura as well.   Zofran -ODT 4mg  for nausea.  Advised to discontinue Fioricet. Limit use of pain relievers to no more than 2 days out of week to prevent risk of rebound or medication-overuse headache. Keep headache diary Given no change in her migraines and normal neurologic exam, I don't feel brain imaging is warranted. Follow up 6 months.  Total time spent in chart and face to face with patient:  62 minutes   Subjective:  Vicki Yang is a 33 year old female with PCOS, migraines, depression and anxiety who follows up for migraine.  UPDATE: Increased Qulipta  to 60mg .  *** Zavzpret ***  Intensity:  *** Duration:  *** Frequency:  ***  Beginning on 11/7, she started having a low-grade (3-4/10) daily headache, lasting several hours a day, from the left upper neck/occipital region.  ***  Frequency of abortive medication: *** Current NSAIDS/analgesics:  none Current triptans:  none Current ergotamine:  none Current anti-emetic:  none Current muscle relaxants:  methocarbamol  500mg  BID Current Antihypertensive medications:  propranolol  20mg  BID (anxiety, ineffective for migraine) Current Antidepressant medications:  bupropion  ER 200mg  QHS Current Anticonvulsant medications:  none Current anti-CGRP:  Qulipta  60mg  daily, Nurtec PRN Current Vitamins/Herbal/Supplements:  none Current Antihistamines/Decongestants:  none Other therapy:  neck massage Birth control:  Lo Loestrin  (PCOS) Other medications:  hydroxyzine    Caffeine:   1 cup of coffee daily.  Occasional soda Alcohol:  occasionally (less than 2 drinks a month) Smoker:  no Diet:  normally drinks 30-40 oz water.  Often skips lunch.   Exercise:  not routine Depression:  yes; Anxiety:  yes.  Controlled. Sleep hygiene:  good.  Falls asleep within 10 minutes.  Usually sleeps through the night (7-8 hours of sleep a night).  Feels rested.  HISTORY: Migraines started around 33 years old.  They are 6/10 (occasionally 8-9/10).  Throbbing/pounding (stabbing with change in position).  Usually left frontal/occipital, occasionally on right side.  Aggravated by movement.  Associated with expressive aphasia/word-finding difficulty, nausea, osmophobia; rarely vomiting, photophobia and phonophobia.  Afterward, will not remember anything that occurred during a migraine.  Usually occur in evening and resolved when wakes up in morning.  Usually lasts several hours.  They were occurring 3 to 4 times a week, worse during school year.  Started Qulipta  in July 2024.  Since then, occurring around less than 5 a month (usually 2-3).  She may have a couple of mild headaches (2/10) and without aphasia).  Triggers stress, seasonal allergies, and alcohol, caffeine withdrawal.  Showers, heating pads, rest and occasionally ice help relieve them.  Nurtec and Fioricet will abort the headache but does not abort the associated symptoms such as aphasia.  Will only abort completely if able to take it within 15 minutes of onset.    On two occasions she had an ocular migraine in which she had visual aura described as a sunspot or a crack in her vision, lasting 30 minutes.  No associated headache.  Even though she has migraine with aura, she states that she needs to take birth control to treat her PCOS.  She is on a low dose of estrogen.    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  She had an episode on 06/09/2023 but did not respond to therapy.  Woke up with vertigo described as severe  spinning.  Could not sit up, stand or walk.  Associated with nausea.  Also with neck pain.  No associated headache.  Seen in the ED.  Treated with meclizine  and discharged on methocarbamol  for neck pain.  First day was severe but continued to have less severe dizziness for another 4 days.  She did subsequently seem to respond to the Epley maneuver.  Treated with dramamine.  May have been stress-induced.  She had her second ocular migraine the following week.    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    Past NSAIDS/analgesics:  ibuprofen, naproxen, Excedrin, acetaminophen, Fioricet Past abortive triptans:  sumatriptan  tab, rizatriptan, zolmitriptan - all exacerbated nausea and vomiting Past abortive ergotamine:  none Past muscle relaxants:  none Past anti-emetic:  Zofran , promethazine Past antihypertensive medications:  none Past antidepressant medications:  nortriptyline, venlafaxine, sertraline, duloxetine Past anticonvulsant medications:  topiramate, zonisamide Past anti-CGRP:  Ajovy  (passed out from needles), Nurtec every other day (wears off) Past vitamins/Herbal/Supplements:  none Past antihistamines/decongestants:  Dramamine, Benadryl, meclizine  Other past therapies:  none   Family history of headache:  mom (migraines), maternal great-grandmother, brother, cousin, maternal aunt.    PAST MEDICAL HISTORY: Past Medical History:  Diagnosis Date   Allergy    Annual physical exam 06/18/2021   Anxiety    Broken wrist    bilateral   Depression    GERD (gastroesophageal reflux disease)    Migraine    PCOS (polycystic ovarian syndrome)     MEDICATIONS: Current Outpatient Medications on File Prior to Visit  Medication Sig Dispense Refill   Atogepant  (QULIPTA ) 60 MG TABS Take 1 tablet (60 mg total) by mouth daily. 90 tablet 1   benzonatate  (TESSALON ) 100 MG  capsule Take 1-2 caps PO TID PRN 20 capsule 0   buPROPion  ER (WELLBUTRIN  SR) 100 MG 12 hr tablet Take 2 tablets (200 mg total) by mouth at bedtime. 180 tablet 3   famotidine  (PEPCID ) 20 MG tablet Take 1 tablet (20 mg total) by mouth 2 (two) times daily. 60 tablet 2   fluticasone (FLONASE) 50 MCG/ACT nasal spray Place 1 spray into both nostrils daily.     hydrOXYzine  (VISTARIL ) 25 MG capsule Take 1-2 capsules (25-50 mg total) by mouth every 6 (six) hours as needed for anxiety. 30 capsule 0   metFORMIN  (GLUCOPHAGE -XR) 500 MG 24 hr tablet Take 1 tablet by mouth daily. 90 tablet 1   methocarbamol  (ROBAXIN ) 500 MG tablet Take 1 tablet (500 mg total) by mouth 2 (two) times daily. 20 tablet 0   Norethindrone-Ethinyl Estradiol-Fe Biphas (LO LOESTRIN FE ) 1 MG-10 MCG / 10 MCG tablet Take 1 tablet by mouth daily. 84 tablet 4   ondansetron  (ZOFRAN -ODT) 4 MG disintegrating tablet Take 1 tablet (4 mg total) by mouth every 8 (eight) hours as needed. 20 tablet 5   propranolol  (INDERAL ) 20 MG tablet Take 1 tablet by mouth twice daily. 180 tablet 1   Rimegepant Sulfate (NURTEC) 75 MG TBDP Take 1 tablet (75 mg total) by mouth daily as needed. 14 tablet 5   rosuvastatin  (CRESTOR ) 10 MG tablet Take 1 tablet by  mouth daily. 90 tablet 1   No current facility-administered medications on file prior to visit.    ALLERGIES: Allergies  Allergen Reactions   Cefoperazone Rash   Sulfa Antibiotics Rash    FAMILY HISTORY: Family History  Problem Relation Age of Onset   Depression Mother    Hypertension Mother    Ovarian cysts Mother    Migraines Mother    Heart disease Mother    Anxiety disorder Mother    Obesity Mother    Hypertension Father    Diabetes type I Father    Retinal degeneration Father    Diabetes Father    Obesity Father    Mental illness Brother    Anxiety disorder Brother    Asthma Brother    Dementia Maternal Grandmother    Diabetes Maternal Grandmother    Lymphoma Maternal Grandmother     Cancer Maternal Grandmother    Obesity Maternal Grandmother    Obesity Paternal Grandmother    Ovarian cancer Maternal Great-grandmother       Objective:  *** General: No acute distress.  Patient appears ***-groomed.   Head:  Normocephalic/atraumatic Eyes:  Fundi examined but not visualized Neck: supple, no paraspinal tenderness, full range of motion Heart:  Regular rate and rhythm Neurological Exam: alert and oriented.  Speech fluent and not dysarthric, language intact.  CN II-XII intact. Bulk and tone normal, muscle strength 5/5 throughout.  Sensation to light touch intact.  Deep tendon reflexes 2+ throughout, toes downgoing.  Finger to nose testing intact.  Gait normal, Romberg negative.   Juliene Dunnings, DO  CC: ***

## 2024-07-11 ENCOUNTER — Ambulatory Visit: Admitting: Neurology

## 2024-07-13 NOTE — Progress Notes (Unsigned)
 NEUROLOGY FOLLOW UP OFFICE NOTE  Vicki Yang [id number]  Assessment/Plan:   Migraine with aura, without status migrainosus, not intractable Ocular migraine Tension-type headache   Migraine prevention:  To further optimize migraine control and reduce severity of migraine attacks, increase Qulipta  to 60mg  daily Migraine rescue:  Nurtec as needed.  Alternatively, will have her try samples of Zavzpret to see if aborts the migraine faster and treats aura as well.   Zofran -ODT 4mg  for nausea.  Advised to discontinue Fioricet. Limit use of pain relievers to no more than 2 days out of week to prevent risk of rebound or medication-overuse headache. Keep headache diary Given no change in her migraines and normal neurologic exam, I don't feel brain imaging is warranted. Follow up 6 months.  Total time spent in chart and face to face with patient:  62 minutes   Subjective:  Vicki Yang is a 33 year old female with PCOS, migraines, depression and anxiety who follows up for migraine.  UPDATE: Increased Qulipta  to 60mg .  *** Zavzpret ***  Intensity:  *** Duration:  *** Frequency:  ***  Beginning on 11/7, she started having a low-grade (3-4/10) daily headache, lasting several hours a day, from the left upper neck/occipital region.  ***  Frequency of abortive medication: *** Current NSAIDS/analgesics:  none Current triptans:  none Current ergotamine:  none Current anti-emetic:  none Current muscle relaxants:  methocarbamol  500mg  BID Current Antihypertensive medications:  propranolol  20mg  BID (anxiety, ineffective for migraine) Current Antidepressant medications:  bupropion  ER 200mg  QHS Current Anticonvulsant medications:  none Current anti-CGRP:  Qulipta  60mg  daily, Nurtec PRN Current Vitamins/Herbal/Supplements:  none Current Antihistamines/Decongestants:  none Other therapy:  neck massage Birth control:  Lo Loestrin  (PCOS) Other medications:  hydroxyzine    Caffeine:   1 cup of coffee daily.  Occasional soda Alcohol:  occasionally (less than 2 drinks a month) Smoker:  no Diet:  normally drinks 30-40 oz water.  Often skips lunch.   Exercise:  not routine Depression:  yes; Anxiety:  yes.  Controlled. Sleep hygiene:  good.  Falls asleep within 10 minutes.  Usually sleeps through the night (7-8 hours of sleep a night).  Feels rested.  HISTORY: Migraines started around 33 years old.  They are 6/10 (occasionally 8-9/10).  Throbbing/pounding (stabbing with change in position).  Usually left frontal/occipital, occasionally on right side.  Aggravated by movement.  Associated with expressive aphasia/word-finding difficulty, nausea, osmophobia; rarely vomiting, photophobia and phonophobia.  Afterward, will not remember anything that occurred during a migraine.  Usually occur in evening and resolved when wakes up in morning.  Usually lasts several hours.  They were occurring 3 to 4 times a week, worse during school year.  Started Qulipta  in July 2024.  Since then, occurring around less than 5 a month (usually 2-3).  She may have a couple of mild headaches (2/10) and without aphasia).  Triggers stress, seasonal allergies, and alcohol, caffeine withdrawal.  Showers, heating pads, rest and occasionally ice help relieve them.  Nurtec and Fioricet will abort the headache but does not abort the associated symptoms such as aphasia.  Will only abort completely if able to take it within 15 minutes of onset.    On two occasions she had an ocular migraine in which she had visual aura described as a sunspot or a crack in her vision, lasting 30 minutes.  No associated headache.  Even though she has migraine with aura, she states that she needs to take birth control to treat her PCOS.  She is on a low dose of estrogen.    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  She had an episode on 06/09/2023 but did not respond to therapy.  Woke up with vertigo described as severe  spinning.  Could not sit up, stand or walk.  Associated with nausea.  Also with neck pain.  No associated headache.  Seen in the ED.  Treated with meclizine  and discharged on methocarbamol  for neck pain.  First day was severe but continued to have less severe dizziness for another 4 days.  She did subsequently seem to respond to the Epley maneuver.  Treated with dramamine.  May have been stress-induced.  She had her second ocular migraine the following week.    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    Past NSAIDS/analgesics:  ibuprofen, naproxen, Excedrin, acetaminophen, Fioricet Past abortive triptans:  sumatriptan  tab, rizatriptan, zolmitriptan - all exacerbated nausea and vomiting Past abortive ergotamine:  none Past muscle relaxants:  none Past anti-emetic:  Zofran , promethazine Past antihypertensive medications:  none Past antidepressant medications:  nortriptyline, venlafaxine, sertraline, duloxetine Past anticonvulsant medications:  topiramate, zonisamide Past anti-CGRP:  Ajovy  (passed out from needles), Nurtec every other day (wears off) Past vitamins/Herbal/Supplements:  none Past antihistamines/decongestants:  Dramamine, Benadryl, meclizine  Other past therapies:  none   Family history of headache:  mom (migraines), maternal great-grandmother, brother, cousin, maternal aunt.    PAST MEDICAL HISTORY: Past Medical History:  Diagnosis Date   Allergy    Annual physical exam 06/18/2021   Anxiety    Broken wrist    bilateral   Depression    GERD (gastroesophageal reflux disease)    Migraine    PCOS (polycystic ovarian syndrome)     MEDICATIONS: Current Outpatient Medications on File Prior to Visit  Medication Sig Dispense Refill   Atogepant  (QULIPTA ) 60 MG TABS Take 1 tablet (60 mg total) by mouth daily. 90 tablet 1   benzonatate  (TESSALON ) 100 MG  capsule Take 1-2 caps PO TID PRN 20 capsule 0   buPROPion  ER (WELLBUTRIN  SR) 100 MG 12 hr tablet Take 2 tablets (200 mg total) by mouth at bedtime. 180 tablet 3   famotidine  (PEPCID ) 20 MG tablet Take 1 tablet (20 mg total) by mouth 2 (two) times daily. 60 tablet 2   fluticasone (FLONASE) 50 MCG/ACT nasal spray Place 1 spray into both nostrils daily.     hydrOXYzine  (VISTARIL ) 25 MG capsule Take 1-2 capsules (25-50 mg total) by mouth every 6 (six) hours as needed for anxiety. 30 capsule 0   metFORMIN  (GLUCOPHAGE -XR) 500 MG 24 hr tablet Take 1 tablet by mouth daily. 90 tablet 1   methocarbamol  (ROBAXIN ) 500 MG tablet Take 1 tablet (500 mg total) by mouth 2 (two) times daily. 20 tablet 0   Norethindrone-Ethinyl Estradiol-Fe Biphas (LO LOESTRIN FE ) 1 MG-10 MCG / 10 MCG tablet Take 1 tablet by mouth daily. 84 tablet 4   ondansetron  (ZOFRAN -ODT) 4 MG disintegrating tablet Take 1 tablet (4 mg total) by mouth every 8 (eight) hours as needed. 20 tablet 5   propranolol  (INDERAL ) 20 MG tablet Take 1 tablet by mouth twice daily. 180 tablet 1   Rimegepant Sulfate (NURTEC) 75 MG TBDP Take 1 tablet (75 mg total) by mouth daily as needed. 14 tablet 5   rosuvastatin  (CRESTOR ) 10 MG tablet Take 1 tablet by  mouth daily. 90 tablet 1   No current facility-administered medications on file prior to visit.    ALLERGIES: Allergies  Allergen Reactions   Cefoperazone Rash   Sulfa Antibiotics Rash    FAMILY HISTORY: Family History  Problem Relation Age of Onset   Depression Mother    Hypertension Mother    Ovarian cysts Mother    Migraines Mother    Heart disease Mother    Anxiety disorder Mother    Obesity Mother    Hypertension Father    Diabetes type I Father    Retinal degeneration Father    Diabetes Father    Obesity Father    Mental illness Brother    Anxiety disorder Brother    Asthma Brother    Dementia Maternal Grandmother    Diabetes Maternal Grandmother    Lymphoma Maternal Grandmother     Cancer Maternal Grandmother    Obesity Maternal Grandmother    Obesity Paternal Grandmother    Ovarian cancer Maternal Great-grandmother       Objective:  *** General: No acute distress.  Patient appears ***-groomed.   Head:  Normocephalic/atraumatic Eyes:  Fundi examined but not visualized Neck: supple, no paraspinal tenderness, full range of motion Heart:  Regular rate and rhythm Neurological Exam: alert and oriented.  Speech fluent and not dysarthric, language intact.  CN II-XII intact. Bulk and tone normal, muscle strength 5/5 throughout.  Sensation to light touch intact.  Deep tendon reflexes 2+ throughout, toes downgoing.  Finger to nose testing intact.  Gait normal, Romberg negative.   Juliene Dunnings, DO  CC: ***

## 2024-07-14 ENCOUNTER — Ambulatory Visit: Admitting: Neurology

## 2024-07-14 ENCOUNTER — Ambulatory Visit: Payer: Self-pay | Admitting: Family Medicine

## 2024-07-14 ENCOUNTER — Encounter: Payer: Self-pay | Admitting: Family Medicine

## 2024-07-14 ENCOUNTER — Encounter: Payer: Self-pay | Admitting: Neurology

## 2024-07-14 VITALS — BP 84/56 | HR 102 | Temp 98.3°F | Ht 63.0 in | Wt 223.0 lb

## 2024-07-14 DIAGNOSIS — Z0000 Encounter for general adult medical examination without abnormal findings: Secondary | ICD-10-CM | POA: Diagnosis not present

## 2024-07-14 DIAGNOSIS — R42 Dizziness and giddiness: Secondary | ICD-10-CM

## 2024-07-14 DIAGNOSIS — K2100 Gastro-esophageal reflux disease with esophagitis, without bleeding: Secondary | ICD-10-CM

## 2024-07-14 DIAGNOSIS — F32A Depression, unspecified: Secondary | ICD-10-CM

## 2024-07-14 DIAGNOSIS — G43709 Chronic migraine without aura, not intractable, without status migrainosus: Secondary | ICD-10-CM

## 2024-07-14 DIAGNOSIS — G43101 Migraine with aura, not intractable, with status migrainosus: Secondary | ICD-10-CM

## 2024-07-14 DIAGNOSIS — E7800 Pure hypercholesterolemia, unspecified: Secondary | ICD-10-CM

## 2024-07-14 DIAGNOSIS — R5382 Chronic fatigue, unspecified: Secondary | ICD-10-CM | POA: Insufficient documentation

## 2024-07-14 DIAGNOSIS — K5904 Chronic idiopathic constipation: Secondary | ICD-10-CM | POA: Diagnosis not present

## 2024-07-14 DIAGNOSIS — F419 Anxiety disorder, unspecified: Secondary | ICD-10-CM | POA: Diagnosis not present

## 2024-07-14 DIAGNOSIS — E559 Vitamin D deficiency, unspecified: Secondary | ICD-10-CM

## 2024-07-14 DIAGNOSIS — E282 Polycystic ovarian syndrome: Secondary | ICD-10-CM | POA: Diagnosis not present

## 2024-07-14 MED ORDER — METFORMIN HCL ER 500 MG PO TB24: 500.0000 mg | ORAL_TABLET | Freq: Every day | ORAL | 3 refills | Status: AC

## 2024-07-14 MED ORDER — BUPROPION HCL ER (SR) 100 MG PO TB12: 200.0000 mg | ORAL_TABLET | Freq: Every day | ORAL | 3 refills | Status: AC

## 2024-07-14 MED ORDER — FAMOTIDINE 20 MG PO TABS: 20.0000 mg | ORAL_TABLET | Freq: Two times a day (BID) | ORAL | 3 refills | Status: AC

## 2024-07-14 MED ORDER — NURTEC 75 MG PO TBDP: 75.0000 mg | ORAL_TABLET | Freq: Every day | ORAL | 5 refills | Status: AC | PRN

## 2024-07-14 MED ORDER — ROSUVASTATIN CALCIUM 10 MG PO TABS: 10.0000 mg | ORAL_TABLET | Freq: Every day | ORAL | 3 refills | Status: AC

## 2024-07-14 MED ORDER — ONDANSETRON 4 MG PO TBDP: 4.0000 mg | ORAL_TABLET | Freq: Three times a day (TID) | ORAL | 5 refills | Status: AC | PRN

## 2024-07-14 MED ORDER — HYDROXYZINE PAMOATE 25 MG PO CAPS: 25.0000 mg | ORAL_CAPSULE | Freq: Four times a day (QID) | ORAL | 0 refills | Status: AC | PRN

## 2024-07-14 MED ORDER — PROPRANOLOL HCL 20 MG PO TABS: 20.0000 mg | ORAL_TABLET | Freq: Two times a day (BID) | ORAL | 3 refills | Status: AC

## 2024-07-14 MED ORDER — QULIPTA 60 MG PO TABS: 1.0000 | ORAL_TABLET | Freq: Every day | ORAL | 1 refills | Status: AC

## 2024-07-14 NOTE — Progress Notes (Signed)
 Annual Physical Exam Visit  Patient Information:  Patient ID: Vicki Yang, female DOB: 15-Sep-1990 Age: 33 y.o. MRN: [id number]   Subjective:   CC: Annual Physical Exam  HPI:  Vicki Yang is here for their annual physical.  I reviewed the past medical history, family history, social history, surgical history, and allergies today and changes were made as necessary.  Please see the problem list section below for additional details.  Past Medical History: Past Medical History:  Diagnosis Date   Allergy    Annual physical exam 06/18/2021   Anxiety    Broken wrist    bilateral   Depression    GERD (gastroesophageal reflux disease)    Migraine    PCOS (polycystic ovarian syndrome)    Past Surgical History: Past Surgical History:  Procedure Laterality Date   CHOLECYSTECTOMY  12/2019   Family History: Family History  Problem Relation Age of Onset   Depression Mother    Hypertension Mother    Ovarian cysts Mother    Migraines Mother    Heart disease Mother    Anxiety disorder Mother    Obesity Mother    Hypertension Father    Diabetes type I Father    Retinal degeneration Father    Diabetes Father    Obesity Father    Mental illness Brother    Anxiety disorder Brother    Asthma Brother    Dementia Maternal Grandmother    Diabetes Maternal Grandmother    Lymphoma Maternal Grandmother    Cancer Maternal Grandmother    Obesity Maternal Grandmother    Obesity Paternal Grandmother    Ovarian cancer Maternal Great-grandmother    Allergies: Allergies[1] Health Maintenance: Health Maintenance  Topic Date Due   Hepatitis B Vaccines 19-59 Average Risk (1 of 3 - 19+ 3-dose series) Never done   HPV VACCINES (1 - 3-dose SCDM series) Never done   COVID-19 Vaccine (1 - 2025-26 season) Never done   Influenza Vaccine  10/31/2024 (Originally 03/03/2024)   Cervical Cancer Screening (HPV/Pap Cotest)  10/28/2028   Hepatitis C Screening  Completed   HIV Screening   Completed   Pneumococcal Vaccine  Aged Out   Meningococcal B Vaccine  Aged Out   DTaP/Tdap/Td  Discontinued    HM Colonoscopy   This patient has no relevant Health Maintenance data.    Medications: Medications Ordered Prior to Encounter[2]  Discussed the use of AI scribe software for clinical note transcription with the patient, who gave verbal consent to proceed.   Objective:   Vitals:   07/14/24 0804  BP: (!) 84/56  Pulse: (!) 102  Temp: 98.3 F (36.8 C)  SpO2: 98%   Vitals:   07/14/24 0804  Weight: 223 lb (101.2 kg)  Height: 5' 3 (1.6 m)   Body mass index is 39.5 kg/m.  General: Well Developed, well nourished, and in no acute distress.  Neuro: Alert and oriented x3, extra-ocular muscles intact, sensation grossly intact. Cranial nerves II through XII are grossly intact, motor, sensory, and coordinative functions are intact. HEENT: Normocephalic, atraumatic, neck supple, no masses, no lymphadenopathy, thyroid  nonenlarged. Oropharynx, nasopharynx, external ear canals are unremarkable. Skin: Warm and dry, no rashes noted.  Cardiac: Regular rate and rhythm, no murmurs rubs or gallops. No peripheral edema. Pulses symmetric. Respiratory: Clear to auscultation bilaterally. Speaking in full sentences.  Abdominal: Soft, nontender, nondistended, positive bowel sounds, no masses, no organomegaly. Musculoskeletal: Stable, and with full range of motion.  Impression and Recommendations:   History  of Present Illness Vicki Yang is a 33 year old female who presents for an annual physical exam.  Fatigue and sleep disturbance - Persistent exhaustion and difficulty initiating tasks despite adequate sleep duration (8-16 hours per night) - No improvement in energy upon awakening, but does not feel unrested - No current depressive symptoms - History of depression characterized by tiredness and exhaustion; currently managed with Wellbutrin  - Snoring present - Family history of  sleep apnea - Unemployed since May  Gastrointestinal symptoms - Constipation managed with daily Miralax, initiated due to side effects from Qlypta - Regular bowel movements with current regimen - Tracks bowel movements due to feeling unwell with irregularity - Gastroesophageal reflux symptoms resolved with Pepcid  (famotidine ) twice daily, taken every night and most mornings  Anxiety related to dental procedure - Significant anxiety regarding upcoming wisdom teeth surgery - Fear of dentists, needles, and surgical procedures - Concern about sleep disruption due to preoperative anxiety  History of vertigo - Single episode of vertigo in the past, attributed to stress - No recurrence since initial episode  Nutritional concerns - Concern about previously low vitamin D  levels; requests recheck - Family history of iron deficiency; requests iron panel  Medication management - Currently taking Wellbutrin , propranolol , famotidine , metformin , and rosuvastatin  through our office - Requests refills for all current medications  Assessment and Plan  Annual examination completed, risk stratification labs ordered, anticipatory guidance provided.  We will follow labs once resulted.  Chronic fatigue Persistent fatigue despite adequate sleep. Possible vitamin D , iron deficiencies, thyroid  abnormalities, stress, and sleep apnea considered. - Ordered vitamin D  level. - Coordinated sleep study.  Suspected obstructive sleep apnea Suspected due to snoring, family history, and persistent fatigue. Discussed CPAP therapy benefits and alternatives. - Coordinated sleep medicine evaluation. - Discussed CPAP therapy.  Major depressive disorder with anxiety Current treatment with Wellbutrin . Anxiety exacerbated by dental procedure. Hydroxyzine  prescribed. Discussed adjunctive therapy options, but she prefers to avoid additional medications at this time. - Prescribed hydroxyzine  25 mg, take 1-2 capsules every  6 hours as needed. - Referred to psychology.  Migraine and tension-type headaches Migraines improved, tension-type headaches persist, possibly related to dental procedure anxiety. - Continue current management and follow up with neurologist.  Constipation secondary to medication Constipation managed with Miralax due to Qlypta side effects. Regular bowel movements reported. - Continue Miralax as needed.  Gastroesophageal reflux disease GERD symptoms well-controlled with Pepcid . - Continue Pepcid  as needed.  Dental procedure-related anxiety Significant anxiety related to wisdom teeth surgery. Hydroxyzine  prescribed. Discussed procedural management with dentist. - Prescribed hydroxyzine  25 mg, take 1-2 capsules every 6 hours as needed. - Discuss procedural management with dentist.  The patient was counselled, risk factors were discussed, and anticipatory guidance given.  Problem List Items Addressed This Visit     Anxiety and depression   Relevant Medications   buPROPion  ER (WELLBUTRIN  SR) 100 MG 12 hr tablet   propranolol  (INDERAL ) 20 MG tablet   hydrOXYzine  (VISTARIL ) 25 MG capsule   Other Relevant Orders   Ambulatory referral to Psychology   Chronic fatigue   Relevant Orders   CBC   TSH + free T4   Iron, TIBC and Ferritin Panel   VITAMIN D  25 Hydroxy (Vit-D Deficiency, Fractures)   Ambulatory referral to Sleep Studies   Chronic vertigo   Constipation   Gastroesophageal reflux disease with esophagitis   Relevant Medications   famotidine  (PEPCID ) 20 MG tablet   Healthcare maintenance - Primary   Relevant Orders   CBC  Comprehensive metabolic panel with GFR   Hemoglobin A1c   Lipid panel   TSH + free T4   Iron, TIBC and Ferritin Panel   VITAMIN D  25 Hydroxy (Vit-D Deficiency, Fractures)   Hyperlipidemia   Relevant Medications   propranolol  (INDERAL ) 20 MG tablet   rosuvastatin  (CRESTOR ) 10 MG tablet   Migraine   Relevant Medications   buPROPion  ER (WELLBUTRIN   SR) 100 MG 12 hr tablet   propranolol  (INDERAL ) 20 MG tablet   rosuvastatin  (CRESTOR ) 10 MG tablet   PCOS (polycystic ovarian syndrome)   Relevant Medications   metFORMIN  (GLUCOPHAGE -XR) 500 MG 24 hr tablet   Vitamin D  deficiency   Relevant Orders   VITAMIN D  25 Hydroxy (Vit-D Deficiency, Fractures)     Orders & Medications Medications:  Meds ordered this encounter  Medications   buPROPion  ER (WELLBUTRIN  SR) 100 MG 12 hr tablet    Sig: Take 2 tablets (200 mg total) by mouth at bedtime.    Dispense:  180 tablet    Refill:  3   propranolol  (INDERAL ) 20 MG tablet    Sig: Take 1 tablet (20 mg total) by mouth 2 (two) times daily.    Dispense:  180 tablet    Refill:  3   famotidine  (PEPCID ) 20 MG tablet    Sig: Take 1 tablet (20 mg total) by mouth 2 (two) times daily.    Dispense:  180 tablet    Refill:  3   metFORMIN  (GLUCOPHAGE -XR) 500 MG 24 hr tablet    Sig: Take 1 tablet (500 mg total) by mouth daily.    Dispense:  90 tablet    Refill:  3   rosuvastatin  (CRESTOR ) 10 MG tablet    Sig: Take 1 tablet (10 mg total) by mouth daily.    Dispense:  90 tablet    Refill:  3   hydrOXYzine  (VISTARIL ) 25 MG capsule    Sig: Take 1-2 capsules (25-50 mg total) by mouth every 6 (six) hours as needed for anxiety.    Dispense:  30 capsule    Refill:  0   Orders Placed This Encounter  Procedures   CBC   Comprehensive metabolic panel with GFR   Hemoglobin A1c   Lipid panel   TSH + free T4   Iron, TIBC and Ferritin Panel   VITAMIN D  25 Hydroxy (Vit-D Deficiency, Fractures)   Ambulatory referral to Psychology   Ambulatory referral to Sleep Studies     No follow-ups on file.    Selinda JINNY Ku, MD, CAQSM   Primary Care Sports Medicine Primary [facility] and [facility] Medicine at [facility]      [1]  Allergies Allergen Reactions   Cefoperazone Rash   Sulfa Antibiotics Rash  [2]  Current Outpatient Medications on File Prior to Visit  Medication Sig Dispense Refill   Atogepant   (QULIPTA ) 60 MG TABS Take 1 tablet (60 mg total) by mouth daily. 90 tablet 1   benzonatate  (TESSALON ) 100 MG capsule Take 1-2 caps PO TID PRN 20 capsule 0   fluticasone (FLONASE) 50 MCG/ACT nasal spray Place 1 spray into both nostrils daily.     methocarbamol  (ROBAXIN ) 500 MG tablet Take 1 tablet (500 mg total) by mouth 2 (two) times daily. 20 tablet 0   Norethindrone-Ethinyl Estradiol-Fe Biphas (LO LOESTRIN FE ) 1 MG-10 MCG / 10 MCG tablet Take 1 tablet by mouth daily. 84 tablet 4   ondansetron  (ZOFRAN -ODT) 4 MG disintegrating tablet Take 1 tablet (4 mg total) by mouth  every 8 (eight) hours as needed. 20 tablet 5   Rimegepant Sulfate (NURTEC) 75 MG TBDP Take 1 tablet (75 mg total) by mouth daily as needed. 14 tablet 5   No current facility-administered medications on file prior to visit.

## 2024-07-14 NOTE — Patient Instructions (Addendum)
-   Obtain fasting labs with orders provided (can have water or black coffee but otherwise no food or drink x 8 hours before labs) - Review information provided - Attend eye doctor annually, dentist every 6 months, work towards or maintain 30 minutes of moderate intensity physical activity at least 5 days per week, and consume a balanced diet - Return in 1 year for physical - Contact us  for any questions between now and then   VISIT SUMMARY:  Today, you had your annual physical exam where we discussed your persistent fatigue, gastrointestinal symptoms, anxiety related to an upcoming dental procedure, and other health concerns. We have made plans to address each of these issues to help improve your overall well-being.  YOUR PLAN:  CHRONIC FATIGUE: You are experiencing persistent fatigue despite getting enough sleep. We are considering possible deficiencies and sleep apnea as causes. -We have ordered a vitamin D  level test. -We have coordinated a sleep study to check for sleep apnea.  SUSPECTED OBSTRUCTIVE SLEEP APNEA: You may have sleep apnea due to your snoring, family history, and ongoing fatigue. -We have coordinated a sleep study to confirm if you have sleep apnea. -We discussed the benefits of CPAP therapy if sleep apnea is confirmed.  MAJOR DEPRESSIVE DISORDER WITH ANXIETY: Your depression is currently managed with Wellbutrin , but you are experiencing anxiety, especially related to your upcoming dental procedure. -We prescribed hydroxyzine  25 mg. Take 1-2 capsules every 6 hours as needed for anxiety. -We referred you to psychology for further support.  MIGRAINE AND TENSION-TYPE HEADACHES: Your migraines have improved, but you still have tension-type headaches, possibly related to anxiety about your dental procedure. -Continue your current headache management plan and follow up with your neurologist.  CONSTIPATION SECONDARY TO MEDICATION: Your constipation is managed with Miralax due to  side effects from Qlypta. -Continue taking Miralax as needed to manage constipation.  GASTROESOPHAGEAL REFLUX DISEASE (GERD): Your GERD symptoms are well-controlled with Pepcid . -Continue taking Pepcid  as needed to manage GERD symptoms.  DENTAL PROCEDURE-RELATED ANXIETY: You have significant anxiety about your upcoming wisdom teeth surgery. -We prescribed hydroxyzine  25 mg. Take 1-2 capsules every 6 hours as needed for anxiety. -Discuss procedural management with your dentist to help alleviate anxiety.

## 2024-07-15 LAB — COMPREHENSIVE METABOLIC PANEL WITH GFR
ALT: 20 IU/L (ref 0–32)
AST: 16 IU/L (ref 0–40)
Albumin: 4.3 g/dL (ref 3.9–4.9)
Alkaline Phosphatase: 117 IU/L — ABNORMAL HIGH (ref 41–116)
BUN/Creatinine Ratio: 15 (ref 9–23)
BUN: 12 mg/dL (ref 6–20)
Bilirubin Total: 0.8 mg/dL (ref 0.0–1.2)
CO2: 21 mmol/L (ref 20–29)
Calcium: 9.8 mg/dL (ref 8.7–10.2)
Chloride: 104 mmol/L (ref 96–106)
Creatinine, Ser: 0.81 mg/dL (ref 0.57–1.00)
Globulin, Total: 2.5 g/dL (ref 1.5–4.5)
Glucose: 93 mg/dL (ref 70–99)
Potassium: 4.3 mmol/L (ref 3.5–5.2)
Sodium: 138 mmol/L (ref 134–144)
Total Protein: 6.8 g/dL (ref 6.0–8.5)
eGFR: 99 mL/min/1.73 (ref >59)

## 2024-07-15 LAB — CBC
Hematocrit: 45.8 % (ref 34.0–46.6)
Hemoglobin: 14.9 g/dL (ref 11.1–15.9)
MCH: 30.3 pg (ref 26.6–33.0)
MCHC: 32.5 g/dL (ref 31.5–35.7)
MCV: 93 fL (ref 79–97)
Platelets: 347 x10E3/uL (ref 150–450)
RBC: 4.92 x10E6/uL (ref 3.77–5.28)
RDW: 11.8 % (ref 11.7–15.4)
WBC: 13.7 x10E3/uL — ABNORMAL HIGH (ref 3.4–10.8)

## 2024-07-15 LAB — TSH+FREE T4
Free T4: 1.28 ng/dL (ref 0.82–1.77)
TSH: 2.23 u[IU]/mL (ref 0.450–4.500)

## 2024-07-15 LAB — LIPID PANEL
Chol/HDL Ratio: 3.8 ratio (ref 0.0–4.4)
Cholesterol, Total: 173 mg/dL (ref 100–199)
HDL: 46 mg/dL (ref >39)
LDL Chol Calc (NIH): 101 mg/dL — ABNORMAL HIGH (ref 0–99)
Triglycerides: 150 mg/dL — ABNORMAL HIGH (ref 0–149)
VLDL Cholesterol Cal: 26 mg/dL (ref 5–40)

## 2024-07-15 LAB — IRON,TIBC AND FERRITIN PANEL
Ferritin: 127 ng/mL (ref 15–150)
Iron Saturation: 17 % (ref 15–55)
Iron: 58 ug/dL (ref 27–159)
Total Iron Binding Capacity: 351 ug/dL (ref 250–450)
UIBC: 293 ug/dL (ref 131–425)

## 2024-07-15 LAB — HEMOGLOBIN A1C
Est. average glucose Bld gHb Est-mCnc: 91 mg/dL
Hgb A1c MFr Bld: 4.8 % (ref 4.8–5.6)

## 2024-07-15 LAB — VITAMIN D 25 HYDROXY (VIT D DEFICIENCY, FRACTURES): Vit D, 25-Hydroxy: 28 ng/mL — ABNORMAL LOW (ref 30.0–100.0)

## 2024-07-17 ENCOUNTER — Ambulatory Visit: Payer: Self-pay | Admitting: Family Medicine

## 2024-07-17 MED ORDER — VITAMIN D (ERGOCALCIFEROL) 1.25 MG (50000 UNIT) PO CAPS: 50000.0000 [IU] | ORAL_CAPSULE | ORAL | 0 refills | Status: AC

## 2024-07-19 NOTE — Telephone Encounter (Signed)
 Please review.  KP

## 2024-08-09 ENCOUNTER — Encounter: Payer: Self-pay | Admitting: Neurology

## 2024-08-14 ENCOUNTER — Telehealth: Payer: Self-pay | Admitting: Pharmacy Technician

## 2024-08-14 ENCOUNTER — Encounter: Payer: Self-pay | Admitting: Sleep Medicine

## 2024-08-14 ENCOUNTER — Ambulatory Visit: Admitting: Sleep Medicine

## 2024-08-14 ENCOUNTER — Other Ambulatory Visit (HOSPITAL_COMMUNITY): Payer: Self-pay

## 2024-08-14 VITALS — BP 118/80 | HR 86 | Temp 98.6°F | Ht 63.0 in | Wt 224.6 lb

## 2024-08-14 DIAGNOSIS — E669 Obesity, unspecified: Secondary | ICD-10-CM | POA: Diagnosis not present

## 2024-08-14 DIAGNOSIS — G4733 Obstructive sleep apnea (adult) (pediatric): Secondary | ICD-10-CM

## 2024-08-14 DIAGNOSIS — Z6839 Body mass index (BMI) 39.0-39.9, adult: Secondary | ICD-10-CM

## 2024-08-14 DIAGNOSIS — G4710 Hypersomnia, unspecified: Secondary | ICD-10-CM

## 2024-08-14 DIAGNOSIS — R0683 Snoring: Secondary | ICD-10-CM

## 2024-08-14 NOTE — Telephone Encounter (Signed)
 PA has been submitted, and telephone encounter has been created. Please see telephone encounter dated 1.12.26.

## 2024-08-14 NOTE — Patient Instructions (Addendum)
 SABRA

## 2024-08-14 NOTE — Telephone Encounter (Signed)
 Pharmacy Patient Advocate Encounter   Received notification from Patient Advice Request messages that prior authorization for QULIPTA  60MG  is required/requested.   Insurance verification completed.   The patient is insured through [facility].   Per test claim: PA required; PA submitted to above mentioned insurance via Latent Key/confirmation #/EOC [id number] Status is pending

## 2024-08-14 NOTE — Progress Notes (Unsigned)
 "      Name:Vicki Yang MRN: [id number] DOB: [birth year]   CHIEF COMPLAINT:  EXCESSIVE DAYTIME SLEEPINESS   HISTORY OF PRESENT ILLNESS: Ms. Vicki Yang is a 34 y.o. w/ a h/o anxiety, depression, GERD, PCOS, migraine headaches, hyperlipidemia and morbid obesity who presents for c/o loud snoring and excessive daytime sleepiness which has been present for several years. Denies any nocturnal awakenings. Reports significant weight changes. Denies morning headaches, RLS symptoms, dream enactment, cataplexy, hypnagogic or hypnapompic hallucinations. Reports a family history of sleep apnea. Denies drowsy driving. Drinks 1 cup of coffee or 1/2 an energy drink daily, denies alcohol, tobacco or illicit drug use.   Bedtime 11 pm-1 am Sleep onset <10 mins Rise time 6:45-7 am   EPWORTH SLEEP SCORE 10    08/14/2024    3:00 PM  Results of the Epworth flowsheet  Sitting and reading 2  Watching TV 2  Sitting, inactive in a public place (e.g. a theatre or a meeting) 0  As a passenger in a car for an hour without a break 3  Lying down to rest in the afternoon when circumstances permit 3  Sitting and talking to someone 0  Sitting quietly after a lunch without alcohol 0  In a car, while stopped for a few minutes in traffic 0  Total score 10    PAST MEDICAL HISTORY :   has a past medical history of Allergy, Annual physical exam (06/18/2021), Anxiety, Broken wrist, Depression, GERD (gastroesophageal reflux disease), Migraine, and PCOS (polycystic ovarian syndrome).  has a past surgical history that includes Cholecystectomy (12/2019). Prior to Admission medications  Medication Sig Start Date End Date Taking? Authorizing Provider  Atogepant  (QULIPTA ) 60 MG TABS Take 1 tablet (60 mg total) by mouth daily. 07/14/24  Yes Jaffe, Adam R, DO  buPROPion  ER (WELLBUTRIN  SR) 100 MG 12 hr tablet Take 2 tablets (200 mg total) by mouth at bedtime. 07/14/24  Yes Alvia Selinda PARAS, MD  famotidine  (PEPCID ) 20 MG  tablet Take 1 tablet (20 mg total) by mouth 2 (two) times daily. 07/14/24  Yes Matthews, Jason J, MD  fluticasone (FLONASE) 50 MCG/ACT nasal spray Place 1 spray into both nostrils daily.   Yes [provider]  hydrOXYzine  (VISTARIL ) 25 MG capsule Take 1-2 capsules (25-50 mg total) by mouth every 6 (six) hours as needed for anxiety. 07/14/24  Yes Alvia Selinda PARAS, MD  metFORMIN  (GLUCOPHAGE -XR) 500 MG 24 hr tablet Take 1 tablet (500 mg total) by mouth daily. 07/14/24  Yes Alvia Selinda PARAS, MD  methocarbamol  (ROBAXIN ) 500 MG tablet Take 1 tablet (500 mg total) by mouth 2 (two) times daily. 06/09/23  Yes Brimage, Vondra, DO  Norethindrone-Ethinyl Estradiol-Fe Biphas (LO LOESTRIN FE ) 1 MG-10 MCG / 10 MCG tablet Take 1 tablet by mouth daily. 10/29/23  Yes Dominic, Jinnie Jansky, CNM  ondansetron  (ZOFRAN -ODT) 4 MG disintegrating tablet Take 1 tablet (4 mg total) by mouth every 8 (eight) hours as needed. 07/14/24  Yes Jaffe, Adam R, DO  propranolol  (INDERAL ) 20 MG tablet Take 1 tablet (20 mg total) by mouth 2 (two) times daily. 07/14/24  Yes Alvia Selinda PARAS, MD  Rimegepant Sulfate (NURTEC) 75 MG TBDP Take 1 tablet (75 mg total) by mouth daily as needed. 07/14/24  Yes Jaffe, Adam R, DO  rosuvastatin  (CRESTOR ) 10 MG tablet Take 1 tablet (10 mg total) by mouth daily. 07/14/24  Yes Alvia Selinda PARAS, MD  Vitamin D , Ergocalciferol , (DRISDOL ) 1.25 MG (50000 UNIT) CAPS capsule Take 1 capsule (50,000 Units  total) by mouth every 7 (seven) days. Take for 8 total doses(weeks) 07/17/24  Yes Alvia Selinda PARAS, MD  benzonatate  (TESSALON ) 100 MG capsule Take 1-2 caps PO TID PRN Patient not taking: Reported on 08/14/2024 12/12/23   Mayers, Kirk RAMAN, PA-C   Allergies[1]  FAMILY HISTORY:  family history includes ADD / ADHD in her brother; Anxiety disorder in her brother and mother; Asthma in her brother; Cancer in her maternal grandmother; Dementia in her maternal grandmother; Depression in her mother; Diabetes in her  father and maternal grandmother; Diabetes type I in her father; Heart disease in her mother; Hypertension in her father and mother; Lymphoma in her maternal grandmother; Mental illness in her brother; Migraines in her mother; Obesity in her father, maternal grandmother, mother, and paternal grandmother; Ovarian cancer in her maternal great-grandmother; Ovarian cysts in her mother; Retinal degeneration in her father. SOCIAL HISTORY:  reports that she has never smoked. She has never used smokeless tobacco. She reports that she does not currently use alcohol. She reports that she does not use drugs.   Review of Systems:  Gen:  Denies  fever, sweats, chills weight loss  HEENT: Denies blurred vision, double vision, ear pain, eye pain, hearing loss, nose bleeds, sore throat Cardiac:  No dizziness, chest pain or heaviness, chest tightness,edema, No JVD Resp:   No cough, -sputum production, -shortness of breath,-wheezing, -hemoptysis,  Gi: Denies swallowing difficulty, stomach pain, nausea or vomiting, diarrhea, constipation, bowel incontinence Gu:  Denies bladder incontinence, burning urine Ext:   Denies Joint pain, stiffness or swelling Skin: Denies  skin rash, easy bruising or bleeding or hives Endoc:  Denies polyuria, polydipsia , polyphagia or weight change Psych:   Denies depression, insomnia or hallucinations  Other:  All other systems negative  VITAL SIGNS: BP 118/80   Pulse 86   Temp 98.6 F (37 C)   Ht 5' 3 (1.6 m)   Wt 224 lb 9.6 oz (101.9 kg)   SpO2 96%   BMI 39.79 kg/m    Physical Examination:   General Appearance: No distress  EYES PERRLA, EOM intact.   NECK Supple, No JVD Pulmonary: normal breath sounds, No wheezing.  CardiovascularNormal S1,S2.  No m/r/g.   Abdomen: Benign, Soft, non-tender. Skin:   warm, no rashes, no ecchymosis  Extremities: normal, no cyanosis, clubbing. Neuro:without focal findings,  speech normal  PSYCHIATRIC: Mood, affect within normal  limits.   ASSESSMENT AND PLAN  OSA I suspect that OSA is likely present due to clinical presentation. Discussed the consequences of untreated sleep apnea. Advised not to drive drowsy for safety of patient and others. Will complete further evaluation with a home sleep study and follow up to review results.    Obesity Counseled patient on diet and lifestyle modification.  Stable, on current management. Following with PCP.    MEDICATION ADJUSTMENTS/LABS AND TESTS ORDERED: Recommend Sleep Study   Patient  satisfied with Plan of action and management. All questions answered  Follow up to review HST results and treatment plan.   I spent a total of 30 minutes reviewing chart data, face-to-face evaluation with the patient, counseling and coordination of care as detailed above.    Mei Suits, M.D.  Sleep Medicine [facility]           [1]  Allergies Allergen Reactions   Cefoperazone Rash   Sulfa Antibiotics Rash   "

## 2024-08-18 ENCOUNTER — Other Ambulatory Visit: Payer: Self-pay | Admitting: Family Medicine

## 2024-08-18 NOTE — Telephone Encounter (Signed)
 Requested Prescriptions  Refused Prescriptions Disp Refills   Vitamin D , Ergocalciferol , (DRISDOL ) 1.25 MG (50000 UNIT) CAPS capsule 8 capsule 0    Sig: Take 1 capsule by mouth every 7 days for 8 total doses (weeks).     Endocrinology:  Vitamins - Vitamin D  Supplementation 2 Failed - 08/18/2024  2:36 PM      Failed - Manual Review: Route requests for 50,000 IU strength to the provider      Failed - Vitamin D  in normal range and within 360 days    Vit D, 25-Hydroxy  Date Value Ref Range Status  07/14/2024 28.0 (L) 30.0 - 100.0 ng/mL Final    Comment:    Vitamin D  deficiency has been defined by the Institute of Medicine and an Endocrine Society practice guideline as a level of serum 25-OH vitamin D  less than 20 ng/mL (1,2). The Endocrine Society went on to further define vitamin D  insufficiency as a level between 21 and 29 ng/mL (2). 1. IOM (Institute of Medicine). 2010. Dietary reference    intakes for calcium  and D. [place]: The    [employer]. 2. Holick MF, Binkley Kelso, Bischoff-Ferrari HA, et al.    Evaluation, treatment, and prevention of vitamin D     deficiency: an Endocrine Society clinical practice    guideline. JCEM. 2011 Jul; 96(7):1911-30.          Passed - Ca in normal range and within 360 days    Calcium   Date Value Ref Range Status  07/14/2024 9.8 8.7 - 10.2 mg/dL Final         Passed - Valid encounter within last 12 months    Recent Outpatient Visits           1 month ago Healthcare maintenance   [facility] Primary Care & Sports Medicine at [facility], Selinda PARAS, MD

## 2024-08-29 ENCOUNTER — Encounter: Payer: Self-pay | Admitting: Family Medicine

## 2024-08-29 NOTE — Telephone Encounter (Signed)
 Pharmacy Patient Advocate Encounter  Received notification from [employer] that Prior Authorization for Qulipta  60MG  tablets has been APPROVED from 08-14-2024 to 02-11-2025   PA #/Case ID/Reference #: [id number]

## 2024-09-04 ENCOUNTER — Ambulatory Visit: Admitting: Neurology

## 2025-01-15 ENCOUNTER — Ambulatory Visit: Payer: Self-pay | Admitting: Neurology

## 2025-07-16 ENCOUNTER — Encounter: Admitting: Family Medicine
